# Patient Record
Sex: Male | Born: 1990
Health system: Southern US, Community
[De-identification: ages and names within clinical notes are randomized; demographics above are authoritative.]

## PROBLEM LIST (undated history)

## (undated) DIAGNOSIS — F1911 Other psychoactive substance abuse, in remission: Secondary | ICD-10-CM

## (undated) HISTORY — PX: OTHER SURGICAL HISTORY: SHX169

---
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
## 2010-11-30 LAB — DIFFERENTIAL
Basophils Absolute: 0
Basophils Absolute: 0.3 — ABNORMAL HIGH
Basophils Relative: 0
Basophils Relative: 0
Eosinophils Absolute: 2.8 — ABNORMAL HIGH
Eosinophils Relative: 6 — ABNORMAL HIGH
Eosinophils Relative: 8 — ABNORMAL HIGH
Eosinophils Relative: 9 — ABNORMAL HIGH
Lymphocytes Relative: 22 — ABNORMAL LOW
Lymphocytes Relative: 38
Lymphocytes Relative: 43
Monocytes Absolute: 1.5 — ABNORMAL HIGH
Monocytes Absolute: 2 — ABNORMAL HIGH
Monocytes Relative: 14 — ABNORMAL HIGH
Neutro Abs: 3.6
Neutro Abs: 10.4 — ABNORMAL HIGH
Neutrophils Relative %: 37 — ABNORMAL LOW
Neutrophils Relative %: 40 — ABNORMAL LOW
Neutrophils Relative %: 41 — ABNORMAL LOW
WBC Morphology: INCREASED

## 2010-11-30 LAB — CBC
HCT: 39.4
HCT: 38.1
HCT: 39.5
Hemoglobin: 13.4
Hemoglobin: 12.3
Hemoglobin: 14.5
MCHC: 34
MCHC: 33.6
MCHC: 34.6
MCHC: 35
MCHC: 35.1
MCV: 86.9
MCV: 88.4
MCV: 87.7
MCV: 88.8
MCV: 89.1
Platelets: 122 — ABNORMAL LOW
Platelets: 177
Platelets: 238
RBC: 4.48
RBC: 4.85
RDW: 13.4
RDW: 13.4
RDW: 13.4
WBC: 4.2 — ABNORMAL LOW
WBC: 5.7
WBC: 25 — ABNORMAL HIGH

## 2010-11-30 LAB — COMPREHENSIVE METABOLIC PANEL
ALT: 123 — ABNORMAL HIGH
ALT: 71 — ABNORMAL HIGH
AST: 70 — ABNORMAL HIGH
Albumin: 3.3 — ABNORMAL LOW
Albumin: 2.2 — ABNORMAL LOW
Albumin: 2.2 — ABNORMAL LOW
Albumin: 2.2 — ABNORMAL LOW
Alkaline Phosphatase: 320 — ABNORMAL HIGH
BUN: 8
BUN: 6
BUN: 9
CO2: 28
CO2: 27
Calcium: 9
Calcium: 8.1 — ABNORMAL LOW
Calcium: 8.6
Chloride: 100
Chloride: 101
Chloride: 105
Chloride: 106
Creatinine, Ser: 0.96
Creatinine, Ser: 0.78
Creatinine, Ser: 0.85
Creatinine, Ser: 0.88
Glucose, Bld: 93
Potassium: 3.3 — ABNORMAL LOW
Sodium: 136
Total Bilirubin: 1.5 — ABNORMAL HIGH
Total Bilirubin: 0.8
Total Bilirubin: 1.4 — ABNORMAL HIGH
Total Bilirubin: 1.5 — ABNORMAL HIGH
Total Protein: 4.6 — ABNORMAL LOW
Total Protein: 4.9 — ABNORMAL LOW

## 2010-11-30 LAB — URINALYSIS, ROUTINE W REFLEX MICROSCOPIC
Glucose, UA: 100 — AB
Glucose, UA: NEGATIVE
Hgb urine dipstick: NEGATIVE
Protein, ur: 100 — AB
Specific Gravity, Urine: 1.019
Urobilinogen, UA: 4 — ABNORMAL HIGH
pH: 6

## 2010-11-30 LAB — TECHNOLOGIST SMEAR REVIEW: Path Review: INCREASED

## 2010-11-30 LAB — EPSTEIN BARR VIRUS(EBV) BY PCR

## 2010-11-30 LAB — CMV IGM: CMV IgM: <0.9 Index (ref <0.90)

## 2010-11-30 LAB — BASIC METABOLIC PANEL
CO2: 32
Chloride: 99
Creatinine, Ser: 1.04
Potassium: 3.6
Sodium: 136

## 2010-11-30 LAB — GC/CHLAMYDIA PROBE AMP, GENITAL
Chlamydia, DNA Probe: NEGATIVE
GC Probe Amp, Genital: NEGATIVE

## 2010-11-30 LAB — GONOCOCCUS CULTURE

## 2010-11-30 LAB — URINE CULTURE: Colony Count: NO GROWTH

## 2010-11-30 LAB — URINE MICROSCOPIC-ADD ON

## 2010-11-30 LAB — LACTATE DEHYDROGENASE: LDH: 392 — ABNORMAL HIGH

## 2010-11-30 LAB — C-REACTIVE PROTEIN
CRP: 13 — ABNORMAL HIGH (ref <0.6)
CRP: 2.4 — ABNORMAL HIGH (ref <0.6)

## 2010-11-30 LAB — SEDIMENTATION RATE
Sed Rate: 8
Sed Rate: 0
Sed Rate: 8

## 2010-11-30 LAB — FERRITIN
Ferritin: 412 — ABNORMAL HIGH (ref 22–322)
Ferritin: 522 — ABNORMAL HIGH (ref 22–322)

## 2010-11-30 LAB — CULTURE, BLOOD (ROUTINE X 2)

## 2010-11-30 LAB — CULTURE, BLOOD (SINGLE)

## 2014-08-31 ENCOUNTER — Emergency Department (HOSPITAL_COMMUNITY): Payer: Self-pay

## 2014-08-31 ENCOUNTER — Emergency Department (HOSPITAL_COMMUNITY)
Admission: EM | Admit: 2014-08-31 | Discharge: 2014-08-31 | Disposition: A | Discharge status: Home / Self Care | Payer: Self-pay | Attending: Emergency Medicine | Admitting: Emergency Medicine

## 2014-08-31 ENCOUNTER — Encounter (HOSPITAL_COMMUNITY): Payer: Self-pay | Admitting: Emergency Medicine

## 2014-08-31 DIAGNOSIS — M545 Low back pain, unspecified: Secondary | ICD-10-CM

## 2014-08-31 DIAGNOSIS — Y9389 Activity, other specified: Secondary | ICD-10-CM | POA: Insufficient documentation

## 2014-08-31 DIAGNOSIS — Y92410 Unspecified street and highway as the place of occurrence of the external cause: Secondary | ICD-10-CM | POA: Insufficient documentation

## 2014-08-31 DIAGNOSIS — S3992XA Unspecified injury of lower back, initial encounter: Secondary | ICD-10-CM | POA: Insufficient documentation

## 2014-08-31 DIAGNOSIS — Y998 Other external cause status: Secondary | ICD-10-CM | POA: Insufficient documentation

## 2014-08-31 DIAGNOSIS — S0990XA Unspecified injury of head, initial encounter: Secondary | ICD-10-CM | POA: Insufficient documentation

## 2014-08-31 DIAGNOSIS — M542 Cervicalgia: Secondary | ICD-10-CM

## 2014-08-31 DIAGNOSIS — S79921A Unspecified injury of right thigh, initial encounter: Secondary | ICD-10-CM | POA: Insufficient documentation

## 2014-08-31 DIAGNOSIS — S199XXA Unspecified injury of neck, initial encounter: Secondary | ICD-10-CM | POA: Insufficient documentation

## 2014-08-31 LAB — I-STAT CHEM 8, ED
BUN: 11 mg/dL (ref 6–20)
CREATININE: 0.8 mg/dL (ref 0.61–1.24)
Calcium, Ion: 1.27 mmol/L — ABNORMAL HIGH (ref 1.12–1.23)
Chloride: 102 mmol/L (ref 101–111)
Glucose, Bld: 87 mg/dL (ref 65–99)
HEMATOCRIT: 44 % (ref 39.0–52.0)
Hemoglobin: 15 g/dL (ref 13.0–17.0)
POTASSIUM: 4.2 mmol/L (ref 3.5–5.1)
SODIUM: 142 mmol/L (ref 135–145)
TCO2: 26 mmol/L (ref 0–100)

## 2014-08-31 MED ORDER — SODIUM CHLORIDE 0.9 % IV SOLN
INTRAVENOUS | Status: DC
  Administered 2014-08-31: 16:00:00 via INTRAVENOUS

## 2014-08-31 MED ORDER — IOHEXOL 300 MG/ML  SOLN
100.0000 mL | Freq: Once | INTRAMUSCULAR | Status: AC | PRN
  Administered 2014-08-31: 100 mL via INTRAVENOUS

## 2014-08-31 MED ORDER — NAPROXEN 250 MG PO TABS: 250.0000 mg | ORAL_TABLET | Freq: Two times a day (BID) | ORAL | Status: DC | PRN

## 2014-08-31 MED ORDER — HYDROCODONE-ACETAMINOPHEN 5-325 MG PO TABS: ORAL_TABLET | ORAL | Status: DC

## 2014-08-31 MED ORDER — METHOCARBAMOL 500 MG PO TABS: 1000.0000 mg | ORAL_TABLET | Freq: Four times a day (QID) | ORAL | Status: DC | PRN
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
## 2014-08-31 NOTE — ED Notes (Signed)
Pt alert & oriented x4, stable gait. Patient given discharge instructions, paperwork & prescription(s). Patient  instructed to stop at the registration desk to finish any additional paperwork. Patient verbalized understanding. Pt left department w/ no further questions. 

## 2014-08-31 NOTE — Discharge Instructions (Signed)
Emergency Department Resource Guide 1) Find a Doctor and Pay Out of Pocket Although you won't have to find out who is covered by your insurance plan, it is a good idea to ask around and get recommendations. You will then need to call the office and see if the doctor you have chosen will accept you as a new patient and what types of options they offer for patients who are self-pay. Some doctors offer discounts or will set up payment plans for their patients who do not have insurance, but you will need to ask so you aren't surprised when you get to your appointment.  2) Contact Your Local Health Department Not all health departments have doctors that can see patients for sick visits, but many do, so it is worth a call to see if yours does. If you don't know where your local health department is, you can check in your phone book. The CDC also has a tool to help you locate your state's health department, and many state websites also have listings of all of their local health departments.  3) Find a [facility] If your illness is not likely to be very severe or complicated, you may want to try a walk in clinic. These are popping up all over the country in pharmacies, drugstores, and shopping centers. They're usually staffed by nurse practitioners or physician assistants that have been trained to treat common illnesses and complaints. They're usually fairly quick and inexpensive. However, if you have serious medical issues or chronic medical problems, these are probably not your best option.  No Primary Care Doctor: - Call Health Connect at  [phone] - they can help you locate a primary care doctor that  accepts your insurance, provides certain services, etc. - Physician Referral Service- [phone]  Chronic Pain Problems: Organization         Address  Phone   Notes  [facility]  [phone] Patients need to be referred by their primary care doctor.   Medication  Assistance: Organization         Address  Phone   Notes  [facility] Medication [facility] [address] [place], [place] [address] [phone] --Must be a resident of [facility] -- Must have NO insurance coverage whatsoever (no Medicaid/ Medicare, etc.) -- The pt. MUST have a primary care doctor that directs their care regularly and follows them in the community   [facility]ist  [phone]   [employer]  [phone]    Agencies that provide inexpensive medical care: Organization         Address  Phone   Notes  [place]  [phone]   [facility]    [phone]   [facility] [place], [place] [address] [phone]   [place] [address], [place] [phone]   Planned Parenthood    [phone]   [facility]    [phone]   [place] and [address], [place] Phone:  [phone], Fax:  [phone] Hours of Operation:  9 am - 6 pm, M-F.  Also accepts Medicaid/Medicare and self-pay.  [facility] [place] [address], [address], [place] Phone: [phone], Fax: [phone]. Hours of Operation:  8:30 am - 5:30 pm, M-F.  Also accepts Medicaid and self-pay.  [facility] [address], [place] Phone: [phone]   [place], [place], [place] [phone], Ext. [phone] Mondays & Thursdays: 7-9 AM.  First 15 patients are seen on a first come, first serve basis.    [place] Providers:  Organization         Address  Phone   Notes  [facility] [address], [place] [phone] Also accepts self-pay patients.  [facility] [address], [place]  [phone]   [place], [address], [place]  [phone]   [facility] [address], [place] [phone]   Lucianne Lei [address], [place]   [phone] Only accepts [place] Access [place] patients after they have their name applied to their card.   Self-Pay (no insurance) in [facility]:  Organization         Address  Phone   Notes  Sickle Cell Patients, [facility] Internal Medicine [place] [phone]   [facility] [place] [phone]   [facility] [place]  [place], [place], [place] [phone]   [facility]/Dr. Osei-Bonsu  [address] [place] or [address], [place] [phone] Phone number for both [place] and [place] locations is the same.  [facility]gent Medical and [facility] [address] [place] [phone]   [facility] [address], [place] or [address] [phone]   [facility] [address], [place] [phone], phone; [phone], fax Sees patients 1st and 3rd Saturday of every month.  Must not qualify for public or private insurance (i.e. Medicaid, Medicare, [place] Health Choice, Veterans' Benefits)  Household income should be no more than 200% of the poverty level The clinic cannot treat you if you are pregnant or think you are pregnant  Sexually transmitted diseases are not treated at the clinic.    Dental Care: Organization         Address  Phone  Notes  [facility] Department of [facility] [place] [phone] Accepts children up to age 75 who are enrolled in [place] or [place]; pregnant women with a Medicaid card; and children who have applied for Medicaid or [place] Health Choice, but were declined, whose parents can pay a reduced fee at time of service.  [facility]  Department of [facility]  [address], [place] [phone] Accepts children up to age 85 who are enrolled in [place] or [place]; pregnant women with a Medicaid card; and children who have applied for Medicaid or [place] Health Choice, but were declined, whose parents can pay a reduced fee at time of service.  [place] Adult Dental Access PROGRAM  [place] [phone] Patients are seen by appointment only. Walk-ins are not accepted. [place] will see patients 39 years of age and older. Monday - Tuesday (8am-5pm) Most Wednesdays (8:30-5pm) $30 per visit, cash only  [facility] Adult Dental Access PROGRAM  [address], [facility] [phone] Patients are seen by appointment only. Walk-ins are not accepted. [place] will see patients 16 years of age and older. One  Wednesday Evening (Monthly: Volunteer Based).  $30 per visit, cash only  [place]  [phone] for adults; Children under age 9, call Graduate Pediatric Dentistry at [phone]. Children aged 70-14, please call [phone] to request a pediatric application.  Dental services are provided in all areas of dental care including fillings, crowns and bridges, complete and partial dentures, implants, gum treatment, root canals, and extractions. Preventive care is also provided. Treatment is provided to both adults and children. Patients are selected via a lottery and there is often a waiting list.   [facility] [address] [place]  [phone] [email or web address]   [facility] [address] [place], [place] [phone], Ext. [phone] Second and Fourth Thursday of each month, opens at 6:30 AM; Clinic ends at 9 AM.  Patients are seen on a first-come first-served basis, and a limited number are seen during each clinic.   [facility]  [address] Hillard Danker [place], [place] [phone]    Eligibility Requirements You must have lived in [place], [place], or [place] counties for at least the last three months.   You cannot be eligible for state or federal sponsored [employer], including [employer], [place], or [employer].   You generally cannot be eligible for healthcare insurance through your employer.    How to apply: Eligibility screenings are held every Tuesday and Wednesday afternoon from 1:00 pm until 4:00 pm. You do not need an appointment for the interview!  [facility] [address] [place], [place]   [place]  [place] Department  [place]  [phone]    Behavioral Health Resources in the Community: Intensive Outpatient Programs Organization         Address  Phone  Notes  [place] Leroy. [address], [place], [place] [phone]   [facility] Outpatient [address] [place], [place]   ADS: Alcohol & Drug Svcs [address], [place], [place]   [place] [address]  [place], [place] or [phone]   Substance Abuse Resources Organization         Address  Phone  Notes  Alcohol and Drug Services  [phone]   [place]  [phone]   [facility] [place]   Chinita Pester  [phone]   Residential & Outpatient Substance Abuse Program  [phone]   Psychological Services Organization         Address  Phone  Notes  [facility] [place]  [place]  [phone]   [place] [address], [place] or [phone]    Mobile Crisis Teams Organization         Address  Phone  Notes  [facility], Mobile Crisis Care Unit  [phone]   [facility]  [address]  [place], [place]   [place] [address] [place] [phone]    Self-Help/Support Groups Organization         Address  Phone             Notes  [place]. of [place] - variety of support groups  [place] Call for more information  Narcotics Anonymous (NA), [facility] [address], [employer] [place]  2 meetings at this location  Residential Treatment Programs Organization         Address  Phone  Notes  [facility] [address]    [place]  [phone]   [facility]  [address] [place] [address]15, [place], [place]   Eugenio Saenz [place], [place] [phone] Admissions: 8am-3pm M-F  Incentives Substance [place] [address]    [place], [place] [phone]   [facility] [address] [place], [place], [place]   [facility] [address],  [place], [place]   [facility] Programs - Intensive Outpatient [address], [place], [place]   [facility] [address]  [place], [place] [phone] or [phone]   [facility]idential Treatment Services (RTS) [address], [place], [place] Accepts Medicaid  [facility] [place] [address],  [place] [phone] Substance Abuse/Addiction Treatment   [facility] Organization         Address  Phone  Notes  [facility]  [phone]   Domenic Schwab, PhD [address] Arlis Porta [place], [place]   [phone] or [phone]   [place], [place] [phone]   [facility] [address] [place], [place] [phone] Insurance/Medicaid/sponsorship through [facility] and Families [address], [address] [place]                                    [place], [place] [phone] [place] [address], [place] [phone]    Dr. Adele Schilder  [phone]   [facility] Dept. 1) [address] [place] 2) [place] 3)  [place] [address], [place] [phone]  [phone]   [place] [phone] or [phone] (After Hours)      Take the prescriptions as directed.  Apply moist heat or ice to the area(s) of discomfort, for 15 minutes at a time, several times per day for the next few days.  Do not fall asleep on a heating or ice pack.  Call your regular medical doctor tomorrow to schedule a follow up appointment in the next 3 days.  Return to the Emergency Department immediately if worsening.

## 2016-04-14 DIAGNOSIS — Z6824 Body mass index (BMI) 24.0-24.9, adult: Secondary | ICD-10-CM | POA: Diagnosis not present

## 2016-04-14 DIAGNOSIS — K648 Other hemorrhoids: Secondary | ICD-10-CM | POA: Diagnosis not present

## 2016-10-29 DIAGNOSIS — L02211 Cutaneous abscess of abdominal wall: Secondary | ICD-10-CM | POA: Diagnosis not present

## 2016-10-29 DIAGNOSIS — Z6826 Body mass index (BMI) 26.0-26.9, adult: Secondary | ICD-10-CM | POA: Diagnosis not present

## 2017-02-15 DIAGNOSIS — L03116 Cellulitis of left lower limb: Secondary | ICD-10-CM | POA: Diagnosis not present

## 2017-02-15 DIAGNOSIS — Z6825 Body mass index (BMI) 25.0-25.9, adult: Secondary | ICD-10-CM | POA: Diagnosis not present

## 2017-05-02 DIAGNOSIS — Z6824 Body mass index (BMI) 24.0-24.9, adult: Secondary | ICD-10-CM | POA: Diagnosis not present

## 2017-05-02 DIAGNOSIS — J019 Acute sinusitis, unspecified: Secondary | ICD-10-CM | POA: Diagnosis not present

## 2018-03-06 DIAGNOSIS — M546 Pain in thoracic spine: Secondary | ICD-10-CM | POA: Diagnosis not present

## 2018-03-06 DIAGNOSIS — M9903 Segmental and somatic dysfunction of lumbar region: Secondary | ICD-10-CM | POA: Diagnosis not present

## 2018-03-06 DIAGNOSIS — M545 Low back pain: Secondary | ICD-10-CM | POA: Diagnosis not present

## 2018-03-06 DIAGNOSIS — M9902 Segmental and somatic dysfunction of thoracic region: Secondary | ICD-10-CM | POA: Diagnosis not present

## 2018-03-10 DIAGNOSIS — M9902 Segmental and somatic dysfunction of thoracic region: Secondary | ICD-10-CM | POA: Diagnosis not present

## 2018-03-10 DIAGNOSIS — M545 Low back pain: Secondary | ICD-10-CM | POA: Diagnosis not present

## 2018-03-10 DIAGNOSIS — Z6823 Body mass index (BMI) 23.0-23.9, adult: Secondary | ICD-10-CM | POA: Diagnosis not present

## 2018-03-10 DIAGNOSIS — R5383 Other fatigue: Secondary | ICD-10-CM | POA: Diagnosis not present

## 2018-03-10 DIAGNOSIS — M9903 Segmental and somatic dysfunction of lumbar region: Secondary | ICD-10-CM | POA: Diagnosis not present

## 2018-03-10 DIAGNOSIS — M546 Pain in thoracic spine: Secondary | ICD-10-CM | POA: Diagnosis not present

## 2018-03-14 DIAGNOSIS — M546 Pain in thoracic spine: Secondary | ICD-10-CM | POA: Diagnosis not present

## 2018-03-14 DIAGNOSIS — M9903 Segmental and somatic dysfunction of lumbar region: Secondary | ICD-10-CM | POA: Diagnosis not present

## 2018-03-14 DIAGNOSIS — M9902 Segmental and somatic dysfunction of thoracic region: Secondary | ICD-10-CM | POA: Diagnosis not present

## 2018-03-14 DIAGNOSIS — M545 Low back pain: Secondary | ICD-10-CM | POA: Diagnosis not present

## 2018-03-19 DIAGNOSIS — M9903 Segmental and somatic dysfunction of lumbar region: Secondary | ICD-10-CM | POA: Diagnosis not present

## 2018-03-19 DIAGNOSIS — M9902 Segmental and somatic dysfunction of thoracic region: Secondary | ICD-10-CM | POA: Diagnosis not present

## 2018-03-19 DIAGNOSIS — M546 Pain in thoracic spine: Secondary | ICD-10-CM | POA: Diagnosis not present

## 2018-03-19 DIAGNOSIS — M545 Low back pain: Secondary | ICD-10-CM | POA: Diagnosis not present

## 2018-04-29 DIAGNOSIS — J019 Acute sinusitis, unspecified: Secondary | ICD-10-CM | POA: Diagnosis not present

## 2018-05-09 DIAGNOSIS — Z6823 Body mass index (BMI) 23.0-23.9, adult: Secondary | ICD-10-CM | POA: Diagnosis not present

## 2018-05-09 DIAGNOSIS — J0101 Acute recurrent maxillary sinusitis: Secondary | ICD-10-CM | POA: Diagnosis not present

## 2018-05-09 DIAGNOSIS — J029 Acute pharyngitis, unspecified: Secondary | ICD-10-CM | POA: Diagnosis not present

## 2019-05-14 ENCOUNTER — Emergency Department (HOSPITAL_COMMUNITY): Payer: BC Managed Care – PPO

## 2019-05-14 ENCOUNTER — Other Ambulatory Visit: Payer: Self-pay

## 2019-05-14 ENCOUNTER — Ambulatory Visit
Admission: EM | Admit: 2019-05-14 | Discharge: 2019-05-14 | Disposition: A | Discharge status: Home / Self Care | Payer: BC Managed Care – PPO | Source: Home / Self Care

## 2019-05-14 ENCOUNTER — Encounter (HOSPITAL_COMMUNITY): Payer: Self-pay | Admitting: Emergency Medicine

## 2019-05-14 ENCOUNTER — Emergency Department (HOSPITAL_COMMUNITY)
Admission: EM | Admit: 2019-05-14 | Discharge: 2019-05-14 | Disposition: A | Discharge status: Home / Self Care | Payer: BC Managed Care – PPO | Attending: Emergency Medicine | Admitting: Emergency Medicine

## 2019-05-14 DIAGNOSIS — K625 Hemorrhage of anus and rectum: Secondary | ICD-10-CM | POA: Diagnosis not present

## 2019-05-14 DIAGNOSIS — F17220 Nicotine dependence, chewing tobacco, uncomplicated: Secondary | ICD-10-CM | POA: Insufficient documentation

## 2019-05-14 DIAGNOSIS — K529 Noninfective gastroenteritis and colitis, unspecified: Secondary | ICD-10-CM

## 2019-05-14 DIAGNOSIS — K921 Melena: Secondary | ICD-10-CM

## 2019-05-14 DIAGNOSIS — K922 Gastrointestinal hemorrhage, unspecified: Secondary | ICD-10-CM | POA: Diagnosis not present

## 2019-05-14 DIAGNOSIS — R197 Diarrhea, unspecified: Secondary | ICD-10-CM | POA: Diagnosis not present

## 2019-05-14 HISTORY — DX: Other psychoactive substance abuse, in remission: F19.11

## 2019-05-14 LAB — URINALYSIS, ROUTINE W REFLEX MICROSCOPIC
Bacteria, UA: NONE SEEN
Bilirubin Urine: NEGATIVE
Glucose, UA: NEGATIVE mg/dL
Hgb urine dipstick: NEGATIVE
Ketones, ur: NEGATIVE mg/dL
Leukocytes,Ua: NEGATIVE
Nitrite: NEGATIVE
Protein, ur: NEGATIVE mg/dL
Specific Gravity, Urine: 1.021 (ref 1.005–1.030)
pH: 8 (ref 5.0–8.0)

## 2019-05-14 LAB — CBC WITH DIFFERENTIAL/PLATELET
Abs Immature Granulocytes: 0.01 10*3/uL (ref 0.00–0.07)
Basophils Absolute: 0.1 10*3/uL (ref 0.0–0.1)
Basophils Relative: 1 %
Eosinophils Absolute: 0.5 10*3/uL (ref 0.0–0.5)
Eosinophils Relative: 9 %
HCT: 40.6 % (ref 39.0–52.0)
Hemoglobin: 13.4 g/dL (ref 13.0–17.0)
Immature Granulocytes: 0 %
Lymphocytes Relative: 20 %
Lymphs Abs: 1 10*3/uL (ref 0.7–4.0)
MCH: 30.7 pg (ref 26.0–34.0)
MCHC: 33 g/dL (ref 30.0–36.0)
MCV: 93.1 fL (ref 80.0–100.0)
Monocytes Absolute: 0.5 10*3/uL (ref 0.1–1.0)
Monocytes Relative: 10 %
Neutro Abs: 3 10*3/uL (ref 1.7–7.7)
Neutrophils Relative %: 60 %
Platelets: 220 10*3/uL (ref 150–400)
RBC: 4.36 MIL/uL (ref 4.22–5.81)
RDW: 12.1 % (ref 11.5–15.5)
WBC: 5 10*3/uL (ref 4.0–10.5)
nRBC: 0 % (ref 0.0–0.2)

## 2019-05-14 LAB — COMPREHENSIVE METABOLIC PANEL
ALT: 43 U/L (ref 0–44)
AST: 34 U/L (ref 15–41)
Albumin: 4.6 g/dL (ref 3.5–5.0)
Alkaline Phosphatase: 50 U/L (ref 38–126)
Anion gap: 7 (ref 5–15)
BUN: 18 mg/dL (ref 6–20)
CO2: 26 mmol/L (ref 22–32)
Calcium: 9.1 mg/dL (ref 8.9–10.3)
Chloride: 104 mmol/L (ref 98–111)
Creatinine, Ser: 0.88 mg/dL (ref 0.61–1.24)
GFR calc Af Amer: >60 mL/min (ref >60)
GFR calc non Af Amer: >60 mL/min (ref >60)
Glucose, Bld: 84 mg/dL (ref 70–99)
Potassium: 3.6 mmol/L (ref 3.5–5.1)
Sodium: 137 mmol/L (ref 135–145)
Total Bilirubin: 0.9 mg/dL (ref 0.3–1.2)
Total Protein: 7.9 g/dL (ref 6.5–8.1)

## 2019-05-14 LAB — POC OCCULT BLOOD, ED: Fecal Occult Bld: POSITIVE — AB

## 2019-05-14 MED ORDER — IOHEXOL 300 MG/ML  SOLN
100.0000 mL | Freq: Once | INTRAMUSCULAR | Status: AC | PRN
  Administered 2019-05-14: 100 mL via INTRAVENOUS

## 2019-05-14 NOTE — ED Triage Notes (Signed)
Pt reports has had bloody stools for the past 1 1/2 months.  Reports has approx 10 episodes per day.  Reports stool is very loose.  Denies abd pain at this time.

## 2019-05-14 NOTE — ED Notes (Signed)
Report given to [facility] RN at [facility] ED.

## 2019-05-14 NOTE — ED Triage Notes (Signed)
Bloody stools for the past 1 1/2 months, 10 episodes of loose stools per day.  Denies pain

## 2019-05-14 NOTE — ED Provider Notes (Signed)
[place]   [id number] 05/14/19 Arrival Time: 1227  CC: ABDOMINAL DISCOMFORT  SUBJECTIVE:  Michael Mcdowell is a 29 y.o. male who presents with complaint of bloody and frequent (apx 10 episodes per day) stools/ diarrhea x 1.5 months.  Denies a precipitating event, trauma, or straining.  Denies changes in diet or excessive NSAID use.  Denies abdominal pain.  Has not tried OTC medications.  Worse with holding bowel movements.  Denies similar symptoms in the past.  Last BM 1 hr ago with bloody stools.  Also reports blood in comode and with wiping, and gas.    Denies fever, chills, appetite changes, weight changes, nausea, vomiting, chest pain, lightheadedness, dizziness, SOB, constipation, melena, dysuria, difficulty urinating, increased frequency or urgency, flank pain, loss of bowel or bladder function.  Denies known person or family hx of Crohns, UC, or colon cancer.    ROS: As per HPI.  All other pertinent ROS negative.     History reviewed. No pertinent past medical history. History reviewed. No pertinent surgical history. Allergies  Allergen Reactions  . Doxycycline Other (See Comments)    Possible reaction.  Pt not sure.   No current facility-administered medications on file prior to encounter.   Current Outpatient Medications on File Prior to Encounter  Medication Sig Dispense Refill  . ibuprofen (ADVIL,MOTRIN) 200 MG tablet Take 600 mg by mouth every 6 (six) hours as needed for moderate pain.     Social History   Socioeconomic History  . Marital status: Single    Spouse name: Not on file  . Number of children: Not on file  . Years of education: Not on file  . Highest education level: Not on file  Occupational History  . Not on file  Tobacco Use  . Smoking status: Never Smoker  . Smokeless tobacco: Current User    Types: Chew  Substance and Sexual Activity  . Alcohol use: Never  . Drug use: Not on file    Comment: pain pills 5 years ago  . Sexual  activity: Not on file  Other Topics Concern  . Not on file  Social History Narrative  . Not on file   Social Determinants of Health   Financial Resource Strain:   . Difficulty of Paying Living Expenses:   Food Insecurity:   . Worried About Charity fundraiser in the Last Year:   . Arboriculturist in the Last Year:   Transportation Needs:   . Film/video editor (Medical):   Marland Kitchen Lack of Transportation (Non-Medical):   Physical Activity:   . Days of Exercise per Week:   . Minutes of Exercise per Session:   Stress:   . Feeling of Stress :   Social Connections:   . Frequency of Communication with Friends and Family:   . Frequency of Social Gatherings with Friends and Family:   . Attends Religious Services:   . Active Member of Clubs or Organizations:   . Attends Archivist Meetings:   Marland Kitchen Marital Status:   Intimate Partner Violence:   . Fear of Current or Ex-Partner:   . Emotionally Abused:   Marland Kitchen Physically Abused:   . Sexually Abused:    Family History  Problem Relation Age of Onset  . Cancer Paternal Grandmother      OBJECTIVE:  Vitals:   05/14/19 1235  BP: (!) 163/78  Pulse: 87  Resp: 18  Temp: 98.6 F (37 C)  TempSrc: Oral  SpO2: 98%  Weight: 145 lb (65.8 kg)  Height: 5' 7"  (1.702 m)    General appearance: Alert; NAD HEENT: NCAT.  PERRL, EOMI grossly; Oropharynx clear.  Lungs: clear to auscultation bilaterally without adventitious breath sounds Heart: regular rate and rhythm.  Abdomen: soft, non-distended; normal active bowel sounds; non-tender to light and deep palpation; nontender at McBurney's point; negative Murphy's sign; no guarding Extremities: no edema; symmetrical with no gross deformities Skin: warm and dry Neurologic: normal gait Psychological: alert and cooperative; normal mood and affect  ASSESSMENT & PLAN:  1. Rectal bleeding   2. Bloody stools    Would like to defer rectal exam to ED provider.   Recommending further  evaluation and management in the ED cannot rule out diverticular bleeding or internal hemorrhoids/ mass in office.  Patient aware and in agreement with plan.  [facility] ED.      Lestine Box, PA-C 05/14/19 1328

## 2019-05-14 NOTE — Discharge Instructions (Addendum)
Clear fluids and regular diet.  Avoid anti-inflammatory medications such as aspirin, Goody powders or BC powders, Advil, ibuprofen, and Aleve.  You may take Tylenol if needed for pain.  Call Dr. Roseanne Kaufman office tomorrow to schedule a follow-up appointment.

## 2019-05-14 NOTE — ED Notes (Signed)
Pt is aware we need urine sample, urinal at bedside.  

## 2019-05-14 NOTE — ED Provider Notes (Signed)
[facility] EMERGENCY DEPARTMENT Provider Note   CSN: [id number] Arrival date & time: 05/14/19  1317     History Chief Complaint  Patient presents with  . Diarrhea    Michael Mcdowell is a 29 y.o. male presenting with an approximate 5-monthhistory of daily bloody stools in addition to description of having no formed stools during this length of time.  He describes having as many as 10 stools per day with bright red liquid blood both on the toilet paper and in the toilet with episodes.  He denies abdominal pain, but does describe intermittent increased gas-like cramping.  He denies fevers or chills, dysuria, back pain, epigastric pain, acid reflux.  He denies any chronic medication use, specifically aspirin or NSAIDs.  He takes an occasional BC powder for headache but less than 1 time per week on average.  No family history of similar symptoms, no Crohn's disease or ulcerative colitis in the family.  He maintains a good appetite with no unexplained weight loss.  He denies rectal pain.  He has had an external hemorrhoid in the past but denies this current complaint.  He has had no recent antibiotic use, denies any other complaints.  HPI     Past Medical History:  Diagnosis Date  . Substance [facility] in [facility] ([facility]     Patient Active Problem List   Diagnosis Date Noted  . KNEE PAIN, RIGHT 10/15/2007  . TALIPES CAVUS 10/15/2007    History reviewed. No pertinent surgical history.     Family History  Problem Relation Age of Onset  . Cancer Paternal Grandmother     Social History   Tobacco Use  . Smoking status: Never Smoker  . Smokeless tobacco: Current User    Types: Chew  Substance Use Topics  . Alcohol use: Never  . Drug use: Not on file    Comment: pain pills 5 years ago    Home Medications Prior to Admission medications   Medication Sig Start Date End Date Taking? Authorizing Provider  ibuprofen (ADVIL,MOTRIN) 200 MG tablet Take 600 mg by mouth every 6 (six)  hours as needed for moderate pain.    [provider]    Allergies    Doxycycline  Review of Systems   Review of Systems  Constitutional: Negative for chills and fever.  HENT: Negative for congestion and sore throat.   Eyes: Negative.   Respiratory: Negative for chest tightness and shortness of breath.   Cardiovascular: Negative for chest pain.  Gastrointestinal: Positive for blood in stool and diarrhea. Negative for abdominal pain, nausea and vomiting.  Genitourinary: Negative.   Musculoskeletal: Negative for arthralgias, joint swelling and neck pain.  Skin: Negative.  Negative for rash and wound.  Neurological: Negative for dizziness, weakness, light-headedness, numbness and headaches.  Psychiatric/Behavioral: Negative.     Physical Exam Updated Vital Signs BP 136/83 (BP Location: Right Arm)   Pulse 74   Temp 98.6 F (37 C) (Oral)   Resp 18   Ht 5' 7"  (1.702 m)   Wt 65.8 kg   SpO2 98%   BMI 22.71 kg/m   Physical Exam Vitals and nursing note reviewed. Exam conducted with a chaperone present.  Constitutional:      Appearance: He is well-developed.  HENT:     Head: Normocephalic and atraumatic.  Eyes:     Conjunctiva/sclera: Conjunctivae normal.  Cardiovascular:     Rate and Rhythm: Normal rate and regular rhythm.     Heart sounds: Normal heart sounds.  Pulmonary:     Effort: Pulmonary effort is normal.     Breath sounds: Normal breath sounds. No wheezing.  Abdominal:     General: Bowel sounds are normal.     Palpations: Abdomen is soft.     Tenderness: There is no abdominal tenderness.  Genitourinary:    Rectum: Guaiac result positive. No mass, tenderness, anal fissure, external hemorrhoid or internal hemorrhoid.  Musculoskeletal:        General: Normal range of motion.     Cervical back: Normal range of motion.  Skin:    General: Skin is warm and dry.  Neurological:     Mental Status: He is alert.     ED Results / Procedures / Treatments     Labs (all labs ordered are listed, but only abnormal results are displayed) Labs Reviewed  URINALYSIS, ROUTINE W REFLEX MICROSCOPIC - Abnormal; Notable for the following components:      Result Value   APPearance TURBID (*)    All other components within normal limits  POC OCCULT BLOOD, ED - Abnormal; Notable for the following components:   Fecal Occult Bld POSITIVE (*)    All other components within normal limits  CBC WITH DIFFERENTIAL/PLATELET  COMPREHENSIVE METABOLIC PANEL    EKG None  Radiology No results found.  Procedures Procedures (including critical care time)  Medications Ordered in ED Medications - No data to display  ED Course  I have reviewed the triage vital signs and the nursing notes.  Pertinent labs & imaging results that were available during my care of the patient were reviewed by me and considered in my medical decision making (see chart for details).    MDM Rules/Calculators/A&P                      Patient with a 1-1/2 to 97-monthhistory of daily bright red blood per rectum, multiple episodes. He is strongly Hemoccult positive although there is no visible blood on sample obtained. His hemoglobin is normal range. Currently pending CT imaging. If his CT scan is stable would anticipate discharge home with close follow-up with gastroenterology.  Discussed with [name] PA-C who assumes care of patient. Final Clinical Impression(s) / ED Diagnoses Final diagnoses:  Gastrointestinal hemorrhage, unspecified gastrointestinal hemorrhage type    Rx / DC Orders ED Discharge Orders    None       ILandis Martins03/11/21 1624    MNoemi Chapel MD 05/14/19 1756

## 2019-05-14 NOTE — Discharge Instructions (Addendum)
Recommending further evaluation and management in the ED cannot rule out diverticular bleeding or internal hemorrhoids/ mass in office.  Patient aware and in agreement with plan.  Will go by private vehicle to [facility] ED.

## 2019-05-14 NOTE — ED Provider Notes (Signed)
   Pt signed out to me by Michael Jefferson, PA-C with CT abd/pelvis pending.  He has a 2 month hx of rectal bleeding.  No significant abdominal pain, weight loss, vomiting or appetite changes.  No fever or hx of previous GI issues.  No recent ASA or other NSAID use.  Guaiac positive stool on exam    CT ABDOMEN PELVIS W CONTRAST  Result Date: 05/14/2019 CLINICAL DATA:  GI bleed. Blood in stool for 2 months with frequent diarrhea EXAM: CT ABDOMEN AND PELVIS WITH CONTRAST TECHNIQUE: Multidetector CT imaging of the abdomen and pelvis was performed using the standard protocol following bolus administration of intravenous contrast. CONTRAST:  151m OMNIPAQUE IOHEXOL 300 MG/ML  SOLN COMPARISON:  08/31/2014 FINDINGS: Lower chest: The lung bases appear clear. No pleural or pericardial effusion identified. Hepatobiliary: No focal liver abnormality is seen. No gallstones, gallbladder wall thickening, or biliary dilatation. Pancreas: Unremarkable. No pancreatic ductal dilatation or surrounding inflammatory changes. Spleen: Normal in size without focal abnormality. Adrenals/Urinary Tract: The adrenal glands are normal. The kidneys are unremarkable. No mass or hydronephrosis identified. The urinary bladder is within normal limits. Stomach/Bowel: Stomach is nondistended. The small bowel loops are unremarkable. No small bowel wall thickening, inflammation or distension. The appendix is visualized and appears normal. The proximal and mid colon appear normal. Mucosal enhancement and mild wall thickening is identified involving the sigmoid colon and rectum. There is hyperemia of the adjacent Vasa recta and multiple prominent pericolonic and perirectal lymph nodes, image 63/2 and image 60/2. Vascular/Lymphatic: No significant vascular findings are present. No enlarged abdominal or pelvic lymph nodes. Reproductive: Prostate is unremarkable. Other: No free fluid or fluid collections identified. Musculoskeletal: No acute or significant  osseous findings. IMPRESSION: 1. Examination is positive for mild wall thickening and mucosal enhancement involving the sigmoid colon and rectum concerning for inflammatory or infectious colitis. No complications identified. 2. Prominent pericolonic and perirectal lymph nodes are identified which may be reactive in etiology. 3. The appendix is visualized and appears normal. Electronically Signed   By: TKerby MoorsM.D.   On: 05/14/2019 16:56    Labs Reviewed  URINALYSIS, ROUTINE W REFLEX MICROSCOPIC - Abnormal; Notable for the following components:      Result Value   APPearance TURBID (*)    All other components within normal limits  POC OCCULT BLOOD, ED - Abnormal; Notable for the following components:   Fecal Occult Bld POSITIVE (*)    All other components within normal limits  CBC WITH DIFFERENTIAL/PLATELET  COMPREHENSIVE METABOLIC PANEL    Consulted GI, Dr. RGala Romneyand discussed findings.  He recommends to have pt contact his office for f/u and likely colonoscopy in near future.  He does not recommend antibiotics at this time given absence of significant abdominal pain, fever and leukocytosis.  Consulted pt to avoid NSAID use.  Return precautions also discussed       [name] PA-C 05/14/19 1750    MNoemi Chapel MD 05/14/19 1755

## 2019-05-20 ENCOUNTER — Ambulatory Visit: Payer: BC Managed Care – PPO | Admitting: Gastroenterology

## 2019-05-20 ENCOUNTER — Other Ambulatory Visit: Payer: Self-pay

## 2019-05-20 ENCOUNTER — Encounter: Payer: Self-pay | Admitting: Gastroenterology

## 2019-05-20 ENCOUNTER — Encounter: Payer: Self-pay | Admitting: *Deleted

## 2019-05-20 DIAGNOSIS — K529 Noninfective gastroenteritis and colitis, unspecified: Secondary | ICD-10-CM | POA: Diagnosis not present

## 2019-05-20 DIAGNOSIS — K625 Hemorrhage of anus and rectum: Secondary | ICD-10-CM | POA: Diagnosis not present

## 2019-05-20 NOTE — Patient Instructions (Addendum)
We have scheduled you for a colonoscopy in the near future.  I recommend a soft (low-fiber diet) for now. I have included a handout.  Further recommendations to follow!   It was a pleasure to see you today. I want to create trusting relationships with patients to provide genuine, compassionate, and quality care. I value your feedback. If you receive a survey regarding your visit,  I greatly appreciate you taking time to fill this out.   Annitta Needs, PhD, ANP-BC [facility]    Low-Fiber Eating Plan Fiber is found in fruits, vegetables, whole grains, and beans. Eating a diet low in fiber helps to reduce how often you have bowel movements and how much you produce during a bowel movement. A low-fiber eating plan may help your digestive system heal if:  You have certain conditions, such as Crohn's disease or diverticulitis.  You recently had radiation therapy on your pelvis or bowel.  You recently had intestinal surgery.  You have a new surgical opening in your abdomen (colostomy or ileostomy).  Your intestine is narrowed (stricture). Your health care provider will determine how long you need to stay on this diet. Your health care provider may recommend that you work with a diet and nutrition specialist (dietitian). What are tips for following this plan? General guidelines  Follow recommendations from your dietitian about how much fiber you should have each day.  Most people on this eating plan should try to eat less than 10 grams (g) of fiber each day. Your daily fiber goal is _________________ g.  Take vitamin and mineral supplements as told by your health care provider or dietitian. Chewable or liquid forms are best when on this eating plan. Reading food labels  Check food labels for the amount of dietary fiber.  Choose foods that have less than 2 grams of fiber in one serving. Cooking  Use white flour and other allowed grains for baking and cooking.  Cook  meat using methods that keep it tender, such as braising or poaching.  Cook eggs until the yolk is completely solid.  Cook with healthy oils, such as olive oil or canola oil. Meal planning   Eat 5-6 small meals throughout the day instead of 3 large meals.  If you are lactose intolerant: ? Choose low-lactose dairy foods. ? Do not eat dairy foods, if told by your dietitian.  Limit fat and oils to less than 8 teaspoons a day.  Eat small portions of desserts. What foods are allowed? The items listed below may not be a complete list. Talk with your dietitian about what dietary choices are best for you. Grains All bread and crackers made with white flour. Waffles, pancakes, and [place] toast. Bagels. Pretzels. Melba toast, zwieback, and matzoh. Cooked and dried cereals that do not contain whole grains, added fiber, seeds, or dried fruit. CornmealDomenick Gong. Hot and cold cereals made with refined corn, wheat, rice, or oats. Plain pasta and noodles. White rice. Vegetables Well-cooked or canned vegetables without skin, seeds, or stems. Cooked potatoes without skins. Vegetable juice. Fruits Soft-cooked or canned fruits without skin and seeds. Peeled ripe banana. Applesauce. Fruit juice without pulp. Meats and other protein foods Ground meat. Tender cuts of meat or poultry. Eggs. Fish, seafood, and shellfish. Smooth nut butters. Tofu. Dairy All milk products and drinks. Lactose-free milks, including rice, soy, and almond milks. Yogurt without fruit, nuts, chocolate, or granola mix-ins. Sour cream. Cottage cheese. Cheese. Beverages Decaf coffee. Fruit and vegetable juices or smoothies (in  small amounts, with no pulp or skins, and with fruits from allowed list). Sports drinks. Herbal tea. Fats and oils Olive oil, canola oil, sunflower oil, flaxseed oil, and grapeseed oil. Mayonnaise. Cream cheese. Margarine. Butter. Sweets and desserts Plain cakes and cookies. Cream pies and pies made with allowed  fruits. Pudding. Custard. Fruit gelatin. Sherbet. Popsicles. Ice cream without nuts. Plain hard candy. Honey. Jelly. Molasses. Syrups, including chocolate syrup. Chocolate. Marshmallows. Gumdrops. Seasoning and other foods Bouillon. Broth. Cream soups made from allowed foods. Strained soup. Casseroles made with allowed foods. Ketchup. Mild mustard. Mild salad dressings. Plain gravies. Vinegar. Spices in moderation. Salt. Sugar. What foods are not allowed? The items listed below may not be a complete list. Talk with your dietitian about what dietary choices are best for you. Grains Whole wheat and whole grain breads and crackers. Multigrain breads and crackers. Rye bread. Whole grain or multigrain cereals. Cereals with nuts, raisins, or coconut. Bran. Coarse wheat cereals. Granola. High-fiber cereals. Cornmeal or corn bread. Whole grain pasta. Wild or brown rice. Quinoa. Popcorn. Buckwheat. Wheat germ. Vegetables Potato skins. Raw or undercooked vegetables. All beans and bean sprouts. Cooked greens. Corn. Peas. Cabbage. Beets. Broccoli. Brussels sprouts. Cauliflower. Mushrooms. Onions. Peppers. Parsnips. Okra. Sauerkraut. Fruit Raw or dried fruit. Berries. Fruit juice with pulp. Prune juice. Meats and other protein foods Tough, fibrous meats with gristle. Fatty meat. Poultry with skin. Fried meat, Sales executive, or fish. Deli or lunch meats. Sausage, bacon, and hot dogs. Nuts and chunky nut butter. Dried peas, beans, and lentils. Dairy Yogurt with fruit, nuts, chocolate, or granola mix-ins. Beverages Caffeinated coffee and teas. Fats and oils Avocado. Coconut. Sweets and desserts Desserts, cookies, or candies that contain nuts or coconut. Dried fruit. Jams and preserves with seeds. Marmalade. Any dessert made with fruits or grains that are not allowed. Seasoning and other foods Corn tortilla chips. Soups made with vegetables or grains that are not allowed. Relish. Horseradish. Angie Fava.  Olives. Summary  Most people on a low-fiber eating plan should eat less than 10 grams of fiber a day. Follow recommendations from your dietitian about how much fiber you should have each day.  Always check food labels to see the dietary fiber content of packaged foods. In general, a low-fiber food will have fewer than 2 grams of fiber per serving.  In general, try to avoid whole grains, raw fruits and vegetables, dried fruit, tough cuts of meat, nuts, and seeds.  Take a vitamin and mineral supplement as told by your health care provider or dietitian. This information is not intended to replace advice given to you by your health care provider. Make sure you discuss any questions you have with your health care provider. Document Revised: 06/13/2018 Document Reviewed: 04/24/2016 Elsevier Patient Education  2020 [employer].

## 2019-05-20 NOTE — H&P (View-Only) (Signed)
Primary Care Physician:  [facility] Primary Gastroenterologist:  Dr. Gala Romney   Chief Complaint  Patient presents with  . Rectal Bleeding    last week was about 10-15 times per day, mixed dark-bright red. Seen at [facility] ED last week. No straining with BM, no constipation    HPI:   Michael Mcdowell is a 29 y.o. male presenting today at the request of the ED due to rectal bleeding. CT abd/pelvis with contrast on 05/14/19 showing mild wall thickening and mucosal enhancement involving sigmoid and rectum. Hgb normal at 13.4.   Onset of rectal bleeding Jan 15th. Dark red blood per rectum. Mainly just blood per rectum, not associated with stool. Gets a gassy feeling with bleeding. Has tenesmus. Will have some rectal bleeding on tissue. No watery stool. Rarely formed bowel movements. Soft stools. Drinking more water, decreasing sodas. Used to drink mtn dew one per day but now avoiding this. No ETOH since then. Prior to last week, was taking Goody powders for headaches about once per week. No longer taking for the past week. Now just taking tylenol. No significant abdominal pain unless holding need to have BM too long. Moreso rumbly, gassy feeling. Appetite is good. No weight loss. Has some bloating.   Past Medical History:  Diagnosis Date  . Substance abuse in remission [facility])     Past Surgical History:  Procedure Laterality Date  . Tyhroglossal duct cyst      No current outpatient medications on file.   No current facility-administered medications for this visit.    Allergies as of 05/20/2019 - Review Complete 05/20/2019  Allergen Reaction Noted  . Doxycycline Other (See Comments) 08/31/2014    Family History  Problem Relation Age of Onset  . Cancer Paternal Grandmother   . Colon cancer Neg Hx   . Colon polyps Neg Hx   . Inflammatory bowel disease Neg Hx     Social History   Socioeconomic History  . Marital status: Single    Spouse name: Not on file  . Number  of children: Not on file  . Years of education: Not on file  . Highest education level: Not on file  Occupational History  . Not on file  Tobacco Use  . Smoking status: Former Smoker    Types: Cigarettes  . Smokeless tobacco: Former Systems developer    Types: Chew  . Tobacco comment: no cigarettes in 4 years,   Substance and Sexual Activity  . Alcohol use: Not Currently    Comment: No ETOH since January   . Drug use: Not Currently    Comment: pain pills 5 years ago, none since then.   . Sexual activity: Not on file  Other Topics Concern  . Not on file  Social History Narrative  . Not on file   Social Determinants of Health   Financial Resource Strain:   . Difficulty of Paying Living Expenses:   Food Insecurity:   . Worried About Charity fundraiser in the Last Year:   . Arboriculturist in the Last Year:   Transportation Needs:   . Film/video editor (Medical):   Marland Kitchen Lack of Transportation (Non-Medical):   Physical Activity:   . Days of Exercise per Week:   . Minutes of Exercise per Session:   Stress:   . Feeling of Stress :   Social Connections:   . Frequency of Communication with Friends and Family:   . Frequency of Social  Gatherings with Friends and Family:   . Attends Religious Services:   . Active Member of Clubs or Organizations:   . Attends Archivist Meetings:   Marland Kitchen Marital Status:   Intimate Partner Violence:   . Fear of Current or Ex-Partner:   . Emotionally Abused:   Marland Kitchen Physically Abused:   . Sexually Abused:     Review of Systems: Gen: Denies any fever, chills, fatigue, weight loss, lack of appetite.  CV: Denies chest pain, heart palpitations, peripheral edema, syncope.  Resp: Denies shortness of breath at rest or with exertion. Denies wheezing or cough.  GI: see HPI GU : Denies urinary burning, urinary frequency, urinary hesitancy MS: Denies joint pain, muscle weakness, cramps, or limitation of movement.  Derm: Denies rash, itching, dry skin Psych:  Denies depression, anxiety, memory loss, and confusion Heme: see HPI  Physical Exam: BP (!) 141/78   Pulse 78   Temp (!) 97.1 F (36.2 C) (Oral)   Ht 5' 7"  (1.702 m)   Wt 158 lb 12.8 oz (72 kg)   BMI 24.87 kg/m  General:   Alert and oriented. Pleasant and cooperative. Well-nourished and well-developed.  Head:  Normocephalic and atraumatic. Eyes:  Without icterus, sclera clear and conjunctiva pink.  Ears:  Normal auditory acuity. Mouth:  Mask in place Lungs:  Clear to auscultation bilaterally. No wheezes, rales, or rhonchi. No distress.  Heart:  S1, S2 present without murmurs appreciated.  Abdomen:  +BS, soft, non-tender and non-distended. No HSM noted. No guarding or rebound. No masses appreciated.  Rectal:  Deferred  Msk:  Symmetrical without gross deformities. Normal posture. Extremities:  Without edema. Neurologic:  Alert and  oriented x4;  grossly normal neurologically. Skin:  Intact without significant lesions or rashes. Psych:  Alert and cooperative. Normal mood and affect.  ASSESSMENT: Michael Mcdowell is a 29 y.o. male presenting today with history of rectal bleeding and tenesmus since January, with findings on CT of mild wall thickening involving sigmoid and rectum. He denies any diarrhea, Hgb is stable, and there is low concern for infectious process due to clinical presentation. As of note, he endorses Goody powders routinely up until last week; he is now avoiding all NSAIDs.   Discussed need for diagnostic colonoscopy in near future. If diarrhea develops, he is to call. In interim, follow low-residue diet.    PLAN:  Proceed with TCS with Dr. Gala Romney in near future: the risks, benefits, and alternatives have been discussed with the patient in detail. The patient states understanding and desires to proceed. Propofol  Low-residue diet provided  Continue to avoid all forms of NSAIDs  Call if any diarrhea or worsening of symptoms   Annitta Needs, PhD,  ANP-BC [facility]

## 2019-05-20 NOTE — Progress Notes (Signed)
Primary Care Physician:  [facility] Primary Gastroenterologist:  Dr. Gala Romney   Chief Complaint  Patient presents with  . Rectal Bleeding    last week was about 10-15 times per day, mixed dark-bright red. Seen at [facility] ED last week. No straining with BM, no constipation    HPI:   Michael Mcdowell is a 29 y.o. male presenting today at the request of the ED due to rectal bleeding. CT abd/pelvis with contrast on 05/14/19 showing mild wall thickening and mucosal enhancement involving sigmoid and rectum. Hgb normal at 13.4.   Onset of rectal bleeding Jan 15th. Dark red blood per rectum. Mainly just blood per rectum, not associated with stool. Gets a gassy feeling with bleeding. Has tenesmus. Will have some rectal bleeding on tissue. No watery stool. Rarely formed bowel movements. Soft stools. Drinking more water, decreasing sodas. Used to drink mtn dew one per day but now avoiding this. No ETOH since then. Prior to last week, was taking Goody powders for headaches about once per week. No longer taking for the past week. Now just taking tylenol. No significant abdominal pain unless holding need to have BM too long. Moreso rumbly, gassy feeling. Appetite is good. No weight loss. Has some bloating.   Past Medical History:  Diagnosis Date  . Substance abuse in remission [facility])     Past Surgical History:  Procedure Laterality Date  . Tyhroglossal duct cyst      No current outpatient medications on file.   No current facility-administered medications for this visit.    Allergies as of 05/20/2019 - Review Complete 05/20/2019  Allergen Reaction Noted  . Doxycycline Other (See Comments) 08/31/2014    Family History  Problem Relation Age of Onset  . Cancer Paternal Grandmother   . Colon cancer Neg Hx   . Colon polyps Neg Hx   . Inflammatory bowel disease Neg Hx     Social History   Socioeconomic History  . Marital status: Single    Spouse name: Not on file  . Number  of children: Not on file  . Years of education: Not on file  . Highest education level: Not on file  Occupational History  . Not on file  Tobacco Use  . Smoking status: Former Smoker    Types: Cigarettes  . Smokeless tobacco: Former Systems developer    Types: Chew  . Tobacco comment: no cigarettes in 4 years,   Substance and Sexual Activity  . Alcohol use: Not Currently    Comment: No ETOH since January   . Drug use: Not Currently    Comment: pain pills 5 years ago, none since then.   . Sexual activity: Not on file  Other Topics Concern  . Not on file  Social History Narrative  . Not on file   Social Determinants of Health   Financial Resource Strain:   . Difficulty of Paying Living Expenses:   Food Insecurity:   . Worried About Charity fundraiser in the Last Year:   . Arboriculturist in the Last Year:   Transportation Needs:   . Film/video editor (Medical):   Marland Kitchen Lack of Transportation (Non-Medical):   Physical Activity:   . Days of Exercise per Week:   . Minutes of Exercise per Session:   Stress:   . Feeling of Stress :   Social Connections:   . Frequency of Communication with Friends and Family:   . Frequency of Social  Gatherings with Friends and Family:   . Attends Religious Services:   . Active Member of Clubs or Organizations:   . Attends Archivist Meetings:   Marland Kitchen Marital Status:   Intimate Partner Violence:   . Fear of Current or Ex-Partner:   . Emotionally Abused:   Marland Kitchen Physically Abused:   . Sexually Abused:     Review of Systems: Gen: Denies any fever, chills, fatigue, weight loss, lack of appetite.  CV: Denies chest pain, heart palpitations, peripheral edema, syncope.  Resp: Denies shortness of breath at rest or with exertion. Denies wheezing or cough.  GI: see HPI GU : Denies urinary burning, urinary frequency, urinary hesitancy MS: Denies joint pain, muscle weakness, cramps, or limitation of movement.  Derm: Denies rash, itching, dry skin Psych:  Denies depression, anxiety, memory loss, and confusion Heme: see HPI  Physical Exam: BP (!) 141/78   Pulse 78   Temp (!) 97.1 F (36.2 C) (Oral)   Ht 5' 7"  (1.702 m)   Wt 158 lb 12.8 oz (72 kg)   BMI 24.87 kg/m  General:   Alert and oriented. Pleasant and cooperative. Well-nourished and well-developed.  Head:  Normocephalic and atraumatic. Eyes:  Without icterus, sclera clear and conjunctiva pink.  Ears:  Normal auditory acuity. Mouth:  Mask in place Lungs:  Clear to auscultation bilaterally. No wheezes, rales, or rhonchi. No distress.  Heart:  S1, S2 present without murmurs appreciated.  Abdomen:  +BS, soft, non-tender and non-distended. No HSM noted. No guarding or rebound. No masses appreciated.  Rectal:  Deferred  Msk:  Symmetrical without gross deformities. Normal posture. Extremities:  Without edema. Neurologic:  Alert and  oriented x4;  grossly normal neurologically. Skin:  Intact without significant lesions or rashes. Psych:  Alert and cooperative. Normal mood and affect.  ASSESSMENT: Michael Mcdowell is a 29 y.o. male presenting today with history of rectal bleeding and tenesmus since January, with findings on CT of mild wall thickening involving sigmoid and rectum. He denies any diarrhea, Hgb is stable, and there is low concern for infectious process due to clinical presentation. As of note, he endorses Goody powders routinely up until last week; he is now avoiding all NSAIDs.   Discussed need for diagnostic colonoscopy in near future. If diarrhea develops, he is to call. In interim, follow low-residue diet.    PLAN:  Proceed with TCS with Dr. Gala Romney in near future: the risks, benefits, and alternatives have been discussed with the patient in detail. The patient states understanding and desires to proceed. Propofol  Low-residue diet provided  Continue to avoid all forms of NSAIDs  Call if any diarrhea or worsening of symptoms   Annitta Needs, PhD,  ANP-BC [facility]

## 2019-06-04 ENCOUNTER — Encounter (HOSPITAL_COMMUNITY): Payer: Self-pay

## 2019-06-04 ENCOUNTER — Encounter (HOSPITAL_COMMUNITY)
Admission: RE | Admit: 2019-06-04 | Discharge: 2019-06-04 | Disposition: A | Discharge status: Home / Self Care | Payer: BC Managed Care – PPO | Source: Ambulatory Visit | Attending: Internal Medicine | Admitting: Internal Medicine

## 2019-06-04 ENCOUNTER — Other Ambulatory Visit: Payer: Self-pay

## 2019-06-04 DIAGNOSIS — K5190 Ulcerative colitis, unspecified, without complications: Secondary | ICD-10-CM

## 2019-06-04 HISTORY — DX: Ulcerative colitis, unspecified, without complications: K51.90

## 2019-06-05 ENCOUNTER — Other Ambulatory Visit (HOSPITAL_COMMUNITY)
Admission: RE | Admit: 2019-06-05 | Discharge: 2019-06-05 | Disposition: A | Discharge status: Home / Self Care | Payer: BC Managed Care – PPO | Source: Ambulatory Visit | Attending: Internal Medicine | Admitting: Internal Medicine

## 2019-06-05 DIAGNOSIS — Z20822 Contact with and (suspected) exposure to covid-19: Secondary | ICD-10-CM | POA: Diagnosis not present

## 2019-06-05 DIAGNOSIS — Z01812 Encounter for preprocedural laboratory examination: Secondary | ICD-10-CM | POA: Insufficient documentation

## 2019-06-06 LAB — SARS CORONAVIRUS 2 (TAT 6-24 HRS): SARS Coronavirus 2: NEGATIVE

## 2019-06-08 ENCOUNTER — Encounter (HOSPITAL_COMMUNITY): Payer: Self-pay | Admitting: Internal Medicine

## 2019-06-08 ENCOUNTER — Encounter (HOSPITAL_COMMUNITY)
Admission: RE | Disposition: A | Discharge status: Home / Self Care | Payer: Self-pay | Source: Home / Self Care | Attending: Internal Medicine

## 2019-06-08 ENCOUNTER — Ambulatory Visit (HOSPITAL_COMMUNITY): Payer: BC Managed Care – PPO | Admitting: Anesthesiology

## 2019-06-08 ENCOUNTER — Ambulatory Visit (HOSPITAL_COMMUNITY)
Admission: RE | Admit: 2019-06-08 | Discharge: 2019-06-08 | Disposition: A | Discharge status: Home / Self Care | Payer: BC Managed Care – PPO | Attending: Internal Medicine | Admitting: Internal Medicine

## 2019-06-08 DIAGNOSIS — K921 Melena: Secondary | ICD-10-CM

## 2019-06-08 DIAGNOSIS — R197 Diarrhea, unspecified: Secondary | ICD-10-CM | POA: Insufficient documentation

## 2019-06-08 DIAGNOSIS — K6289 Other specified diseases of anus and rectum: Secondary | ICD-10-CM | POA: Diagnosis not present

## 2019-06-08 DIAGNOSIS — K5190 Ulcerative colitis, unspecified, without complications: Secondary | ICD-10-CM | POA: Insufficient documentation

## 2019-06-08 DIAGNOSIS — K529 Noninfective gastroenteritis and colitis, unspecified: Secondary | ICD-10-CM | POA: Diagnosis not present

## 2019-06-08 DIAGNOSIS — Z87891 Personal history of nicotine dependence: Secondary | ICD-10-CM | POA: Diagnosis not present

## 2019-06-08 DIAGNOSIS — R933 Abnormal findings on diagnostic imaging of other parts of digestive tract: Secondary | ICD-10-CM | POA: Diagnosis not present

## 2019-06-08 DIAGNOSIS — Z881 Allergy status to other antibiotic agents status: Secondary | ICD-10-CM | POA: Diagnosis not present

## 2019-06-08 HISTORY — PX: BIOPSY: SHX5522

## 2019-06-08 HISTORY — PX: COLONOSCOPY WITH PROPOFOL: SHX5780

## 2019-06-08 SURGERY — COLONOSCOPY WITH PROPOFOL
Anesthesia: General

## 2019-06-08 MED ORDER — STERILE WATER FOR IRRIGATION IR SOLN
Status: DC | PRN
  Administered 2019-06-08: 13:00:00 100 mL

## 2019-06-08 MED ORDER — LACTATED RINGERS IV SOLN: Freq: Once | INTRAVENOUS | Status: AC

## 2019-06-08 MED ORDER — LIDOCAINE HCL (CARDIAC) PF 100 MG/5ML IV SOSY
PREFILLED_SYRINGE | INTRAVENOUS | Status: DC | PRN
  Administered 2019-06-08: 50 mg via INTRATRACHEAL

## 2019-06-08 MED ORDER — CHLORHEXIDINE GLUCONATE CLOTH 2 % EX PADS: 6.0000 | MEDICATED_PAD | Freq: Once | CUTANEOUS | Status: DC

## 2019-06-08 MED ORDER — PROPOFOL 10 MG/ML IV BOLUS
INTRAVENOUS | Status: DC | PRN
  Administered 2019-06-08: 100 ug via INTRAVENOUS

## 2019-06-08 MED ORDER — LACTATED RINGERS IV SOLN: INTRAVENOUS | Status: DC | PRN

## 2019-06-08 MED ORDER — PROPOFOL 500 MG/50ML IV EMUL
INTRAVENOUS | Status: DC | PRN
  Administered 2019-06-08: 200 ug/kg/min via INTRAVENOUS

## 2019-06-08 NOTE — Transfer of Care (Signed)
Immediate Anesthesia Transfer of Care Note  Patient: Michael Mcdowell  Procedure(s) Performed: COLONOSCOPY WITH PROPOFOL (N/A ) BIOPSY  Patient Location: PACU  Anesthesia Type:General  Level of Consciousness: awake, alert  and oriented  Airway & Oxygen Therapy: Patient Spontanous Breathing  Post-op Assessment: Report given to RN, Post -op Vital signs reviewed and stable and Patient moving all extremities X 4  Post vital signs: Reviewed and stable  Last Vitals:  Vitals Value Taken Time  BP 114/49 06/08/19 1325  Temp    Pulse 74 06/08/19 1327  Resp 12 06/08/19 1327  SpO2 100 % 06/08/19 1327  Vitals shown include unvalidated device data.  Last Pain:  Vitals:   06/08/19 1300  TempSrc:   PainSc: 0-No pain         Complications: No apparent anesthesia complications

## 2019-06-08 NOTE — Interval H&P Note (Signed)
History and Physical Interval Note:  06/08/2019 12:45 PM  Michael Mcdowell  has presented today for surgery, with the diagnosis of rectal bleeding.  The various methods of treatment have been discussed with the patient and family. After consideration of risks, benefits and other options for treatment, the patient has consented to  Procedure(s) with comments: COLONOSCOPY WITH PROPOFOL (N/A) - 12:15pm as a surgical intervention.  The patient's history has been reviewed, patient examined, no change in status, stable for surgery.  I have reviewed the patient's chart and labs.  Questions were answered to the patient's satisfaction.     Michael Mcdowell  Patient essentially unchanged and seen in the office.  Stop Goody powders but has been taking ibuprofen for tooth problem  Essentially no abdominal pain.  Diagnostic colonoscopy today per plan.  The risks, benefits, limitations, alternatives and imponderables have been reviewed with the patient. Questions have been answered. All parties are agreeable.

## 2019-06-08 NOTE — Anesthesia Preprocedure Evaluation (Signed)
Anesthesia Evaluation  Patient identified by MRN, date of birth, ID band Patient awake    Reviewed: Allergy & Precautions, NPO status , Patient's Chart, lab work & pertinent test results  History of Anesthesia Complications Negative for: history of anesthetic complications  Airway Mallampati: II  TM Distance: >3 FB Neck ROM: Full    Dental  (+) Dental Advisory Given, Chipped,  Cavities :   Pulmonary former smoker,    Pulmonary exam normal breath sounds clear to auscultation       Cardiovascular negative cardio ROS Normal cardiovascular exam Rhythm:Regular Rate:Normal     Neuro/Psych negative neurological ROS  negative psych ROS   GI/Hepatic negative GI ROS, (+)     substance abuse (last used - 5 years ago)  ,   Endo/Other  negative endocrine ROS  Renal/GU negative Renal ROS  negative genitourinary   Musculoskeletal  (+) narcotic dependent  Abdominal   Peds negative pediatric ROS (+)  Hematology negative hematology ROS (+)   Anesthesia Other Findings   Reproductive/Obstetrics                             Anesthesia Physical Anesthesia Plan  ASA: I  Anesthesia Plan: General   Post-op Pain Management:    Induction: Intravenous  PONV Risk Score and Plan: 2 and Treatment may vary due to age or medical condition and TIVA  Airway Management Planned: Nasal Cannula and Natural Airway  Additional Equipment:   Intra-op Plan:   Post-operative Plan:   Informed Consent: I have reviewed the patients History and Physical, chart, labs and discussed the procedure including the risks, benefits and alternatives for the proposed anesthesia with the patient or authorized representative who has indicated his/her understanding and acceptance.     Dental advisory given  Plan Discussed with: CRNA and Surgeon  Anesthesia Plan Comments:         Anesthesia Quick Evaluation

## 2019-06-08 NOTE — Anesthesia Postprocedure Evaluation (Signed)
Anesthesia Post Note  Patient: Michael Mcdowell  Procedure(s) Performed: COLONOSCOPY WITH PROPOFOL (N/A ) BIOPSY  Patient location during evaluation: Endoscopy Anesthesia Type: General Level of consciousness: awake and alert Pain management: pain level controlled Vital Signs Assessment: post-procedure vital signs reviewed and stable Respiratory status: spontaneous breathing, nonlabored ventilation, respiratory function stable and patient connected to nasal cannula oxygen Cardiovascular status: blood pressure returned to baseline and stable Postop Assessment: no apparent nausea or vomiting Anesthetic complications: no     Last Vitals:  Vitals:   06/08/19 1345 06/08/19 1352  BP: (!) 118/58 124/73  Pulse: 80 77  Resp: 20 18  Temp:  36.6 C  SpO2: 100% 99%    Last Pain:  Vitals:   06/08/19 1352  TempSrc: Oral  PainSc: 0-No pain                 Talitha Givens

## 2019-06-08 NOTE — Discharge Instructions (Signed)
Colonoscopy Discharge Instructions  Read the instructions outlined below and refer to this sheet in the next few weeks. These discharge instructions provide you with general information on caring for yourself after you leave the hospital. Your doctor may also give you specific instructions. While your treatment has been planned according to the most current medical practices available, unavoidable complications occasionally occur. If you have any problems or questions after discharge, call Dr. Gala Romney at [phone]. ACTIVITY  You may resume your regular activity, but move at a slower pace for the next 24 hours.   Take frequent rest periods for the next 24 hours.   Walking will help get rid of the air and reduce the bloated feeling in your belly (abdomen).   No driving for 24 hours (because of the medicine (anesthesia) used during the test).    Do not sign any important legal documents or operate any machinery for 24 hours (because of the anesthesia used during the test).  NUTRITION  Drink plenty of fluids.   You may resume your normal diet as instructed by your doctor.   Begin with a light meal and progress to your normal diet. Heavy or fried foods are harder to digest and may make you feel sick to your stomach (nauseated).   Avoid alcoholic beverages for 24 hours or as instructed.  MEDICATIONS  You may resume your normal medications unless your doctor tells you otherwise.  WHAT YOU CAN EXPECT TODAY  Some feelings of bloating in the abdomen.   Passage of more gas than usual.   Spotting of blood in your stool or on the toilet paper.  IF YOU HAD POLYPS REMOVED DURING THE COLONOSCOPY:  No aspirin products for 7 days or as instructed.   No alcohol for 7 days or as instructed.   Eat a soft diet for the next 24 hours.  FINDING OUT THE RESULTS OF YOUR TEST Not all test results are available during your visit. If your test results are not back during the visit, make an appointment  with your caregiver to find out the results. Do not assume everything is normal if you have not heard from your caregiver or the medical facility. It is important for you to follow up on all of your test results.  SEEK IMMEDIATE MEDICAL ATTENTION IF:  You have more than a spotting of blood in your stool.   Your belly is swollen (abdominal distention).   You are nauseated or vomiting.   You have a temperature over 101.   You have abdominal pain or discomfort that is severe or gets worse throughout the day.    Ulcerative Colitis, Adult  Ulcerative colitis is long-lasting (chronic) inflammation of the large intestine (colon) and rectum. Sores (ulcers) may also form in these areas. Ulcerative colitis, along with a closely related condition called Crohn's disease, is often referred to as inflammatory bowel disease (IBD). What are the causes? This condition may be caused by increased activity of the immune system in the intestines. The immune system is the system that protects the body against harmful bacteria, viruses, fungi, and other things that can make you sick. The cause of the increased activity of the immune system is not known. What increases the risk? The following factors may make you more likely to develop this condition:  Being 59-71 years old. The risk is also increased for people who are 75-7 years old.  Having a family history of ulcerative colitis.  Being of Jewish descent. What are the signs  or symptoms? Symptoms vary depending on how severe the condition is. Common symptoms include:  Rectal bleeding.  Diarrhea, often with blood or pus in the stool. Other symptoms can include:  Pain or cramping in the abdomen.  Fever.  Fatigue.  Weight loss.  Night sweats.  Rectal pain.  A strong and sudden need to have a bowel movement (bowel urgency).  Nausea.  Loss of appetite.  Anemia.  Yellowing of the skin (jaundice) from liver dysfunction.  Joint pain or  soreness.  Eye irritation.  Skin rashes. Symptoms can range from mild to severe. They may come and go. How is this diagnosed? This condition may be diagnosed based on:  Your symptoms and medical history.  A physical exam.  Tests, including: ? Blood tests and stool tests. ? X-ray. ? A CT scan. ? An MRI. ? Colonoscopy. For this test, a flexible tube is inserted into your anus, and your colon is examined. ? Biopsy. In this test, a tissue sample is taken from your colon and examined under a microscope. How is this treated? Treatment for this condition may include medicines to:  Decrease swelling and inflammation.  Control your immune system.  Treat infections.  Relieve pain.  Control diarrhea. Severe flare-ups may need to be treated at a hospital. Treatment in a hospital may involve:  Resting the bowel. This involves not eating or drinking for a period of time.  Getting medicines through an IV.  Getting fluids and nutrition through: ? An IV. ? A tube that is passed through the nose and into the stomach (nasogastric tube, or NG tube).  Surgery to remove the affected part of the colon. This may be done if other treatments are not helping. This condition increases the risk of colon cancer. Adults with this condition will need to be watched for colon cancer throughout life. Follow these instructions at home: Medicines and vitamins  Take over-the-counter and prescription medicines only as told by your health care provider. Do not take aspirin.  If you were prescribed an antibiotic medicine, take it as told by your health care provider. Do not stop taking the antibiotic even if you start to feel better.  Ask your health care provider if you should take any vitamins or supplements. You may need to take: ? Calcium and vitamin D for bone health. ? Iron to help treat anemia. Lifestyle  Exercise regularly.  Work with your health care provider to manage your condition and  educate yourself about your condition.  Do not use any products that contain nicotine or tobacco, such as cigarettes, e-cigarettes, and chewing tobacco. If you need help quitting, ask your health care provider.  If you drink alcohol: ? Limit how much you use to:  0-1 drink a day for women.  0-2 drinks a day for men. ? Be aware of how much alcohol is in your drink. In the [place] one drink equals one 12 oz bottle of beer (355 mL), one 5 oz glass of wine (148 mL), or one 1 oz glass of hard liquor (44 mL). Eating and drinking  Drink enough fluid to keep your urine pale yellow.  Ask your health care provider about the best diet for you. Follow the diet as told by your health care provider. This may include: ? Avoiding carbonated drinks. ? Avoiding popcorn, vegetable skins, nuts, and other high-fiber foods. ? Avoiding high-fat foods. ? Eating smaller meals more often. ? Limiting your intake of sugary drinks. ? Limiting your caffeine intake.  Follow food safety recommendations as told by your health care provider. This may include making sure you: ? Avoid eating raw or undercooked meat, fish, or eggs. ? Do not eat or drink spoiled or expired foods and drinks.  Keep a food diary. This may help you identify and avoid any foods that trigger your symptoms. General instructions  Wash your hands often with soap and water. If soap and water are not available, use hand sanitizer.  Stay up to date on your vaccinations, including a yearly (annual) flu shot. Ask your health care provider which vaccines you should get.  Follow recommendations from your health care provider for having cancer screening tests. Ulcerative colitis may place you at increased risk for colon cancer.  Keep all follow-up visits as told by your health care provider. This is important. Contact a health care provider if:  Your symptoms do not improve or they get worse with treatment.  You continue to lose weight.  You  have constant cramps or loose stools.  You develop a new skin rash, skin sores, or eye problems.  You have a fever or chills. Get help right away if:  You have bloody diarrhea.  You have severe bleeding from the rectum.  You feel that your heart is racing (tachycardia).  You have severe pain in your abdomen.  Your abdomen swells (abdominal distension).  Your abdomen is tender to the touch.  You vomit. Summary  Ulcerative colitis is long-lasting (chronic) inflammation of the large intestine (colon) and rectum. Sores (ulcers) may also form in these areas.  Follow instructions from your health care provider about medicines, lifestyle changes, and eating and drinking.  Contact your health care provider if symptoms do not improve or they get worse with treatment.  Get help right away if you have severe abdominal pain, abdominal swelling, or severe bleeding from the rectum.  Keep all follow-up visits as told by your health care provider. This is important. This information is not intended to replace advice given to you by your health care provider. Make sure you discuss any questions you have with your health care provider. Document Revised: 12/09/2017 Document Reviewed: 12/11/2017 Elsevier Patient Education  [place].   Your rectum and colon are inflamed consistent with ulcerative colitis, a form of inflammatory bowel disease.  Biopsies were taken.  Begin Entocort 9 mg (3 tablets) daily.  Prescription provided.  Further recommendations to follow pending review of pathology report  Office visit with [place] in 1 month  At patient request, I called [place] at [phone] -rolled to voicemail  Avoid aspirin/NSAID products like ibuprofen is much as possible these types of medications can worsen colitis

## 2019-06-08 NOTE — Op Note (Signed)
[facility] Patient Name: Michael Mcdowell Procedure Date: 06/08/2019 12:10 PM MRN: [id number] Date of Birth: 01/04/1991 Attending Michael Mcdowell: Norvel Richards , Michael Mcdowell CSN: [id number] Age: 29 Admit Type: Outpatient Procedure:                Colonoscopy Indications:              Clinically significant diarrhea of unexplained                            origin, Hematochezia, Abnormal CT of the GI tract Providers:                Norvel Richards, Michael Mcdowell, Rosina Lowenstein, RN, Raphael Gibney, Technician Referring Michael Mcdowell:              Medicines:                Propofol per Anesthesia Complications:            No immediate complications. Estimated Blood Loss:     Estimated blood loss was minimal. Procedure:                Pre-Anesthesia Assessment:                           - Prior to the procedure, a History and Physical                            was performed, and patient medications and                            allergies were reviewed. The patient's tolerance of                            previous anesthesia was also reviewed. The risks                            and benefits of the procedure and the sedation                            options and risks were discussed with the patient.                            All questions were answered, and informed consent                            was obtained. Prior Anticoagulants: The patient has                            taken no previous anticoagulant or antiplatelet                            agents. ASA Grade Assessment: II - A patient with  mild systemic disease. After reviewing the risks                            and benefits, the patient was deemed in                            satisfactory condition to undergo the procedure.                           After obtaining informed consent, the colonoscope                            was passed under direct vision. Throughout the                             procedure, the patient's blood pressure, pulse, and                            oxygen saturations were monitored continuously. The                            CF-[id number] ([id number]) scope was introduced through                            the anus and advanced to the the cecum, identified                            by appendiceal orifice and ileocecal valve. The                            colonoscopy was performed without difficulty. The                            patient tolerated the procedure well. The quality                            of the bowel preparation was adequate. Scope In: 1:01:20 PM Scope Out: 1:17:57 PM Scope Withdrawal Time: 0 hours 11 minutes 32 seconds  Total Procedure Duration: 0 hours 16 minutes 37 seconds  Findings:      The perianal and digital rectal examinations were normal. The rectal       mucosa was diffusely abnormal granularity friability erosions and       exudateno normal; pleat loss of the normal vascular pattern.       Inflammatory changes extended up in the proximal descending segment       where there was a transition to normal-appearing mucosa until the cecum       was reached. In the base the cecum there is a similar patch of eroded       friable granular mucosa. Segmental biopsies of the cecum transverse       descending, sigmoid rectal mucosa were taken and submitted separately. I       elected not to perform a retroflexion in the rectum due to the degree of       inflammation. Impression:               -  Inflamed rectum and left colon with focal                            inflammation of the cecum findings likely represent                            ulcerative colitis in evolution. Status post                            segmental biopsy. Moderate Sedation:      Moderate (conscious) sedation was personally administered by an       anesthesia professional. The following parameters were monitored: oxygen       saturation, heart rate, blood pressure,  respiratory rate, EKG, adequacy       of pulmonary ventilation, and response to care. Recommendation:           - Patient has a contact number available for                            emergencies. The signs and symptoms of potential                            delayed complications were discussed with the                            patient. Return to normal activities tomorrow.                            Written discharge instructions were provided to the                            patient.                           - Advance diet as tolerated. Begin Entocort 9 mg                            daily. Avoid aspirin/NSAID products. Follow-up on                            pathology. Office visit with [place] in 1 month Procedure Code(s):        --- Professional ---                           [phone], Colonoscopy, flexible; diagnostic, including                            collection of specimen(s) by brushing or washing,                            when performed (separate procedure) Diagnosis Code(s):        --- Professional ---                           R19.7, Diarrhea, unspecified  K92.1, Melena (includes Hematochezia)                           R93.3, Abnormal findings on diagnostic imaging of                            other parts of digestive tract CPT copyright 2019 [employer]. All rights reserved. The codes documented in this report are preliminary and upon coder review may  be revised to meet current compliance requirements. Michael Mcdowell. Michael Zobel, Michael Mcdowell Norvel Richards, Michael Mcdowell 06/08/2019 1:30:08 PM This report has been signed electronically. Number of Addenda: 0

## 2019-06-09 LAB — SURGICAL PATHOLOGY

## 2019-06-13 ENCOUNTER — Encounter: Payer: Self-pay | Admitting: Internal Medicine

## 2019-06-15 ENCOUNTER — Telehealth: Payer: Self-pay

## 2019-06-15 NOTE — Telephone Encounter (Signed)
Letter mailed with UC information.

## 2019-06-15 NOTE — Telephone Encounter (Signed)
OV made °

## 2019-06-15 NOTE — Telephone Encounter (Signed)
Per RMR- Send letter to patient.  Send copy of letter with path to referring provider and PCP.   Send info on UC..   should have ov with extender.

## 2019-07-10 ENCOUNTER — Other Ambulatory Visit: Payer: Self-pay

## 2019-07-10 ENCOUNTER — Encounter: Payer: Self-pay | Admitting: Gastroenterology

## 2019-07-10 ENCOUNTER — Telehealth: Payer: Self-pay

## 2019-07-10 ENCOUNTER — Telehealth (INDEPENDENT_AMBULATORY_CARE_PROVIDER_SITE_OTHER): Payer: BC Managed Care – PPO | Admitting: Gastroenterology

## 2019-07-10 DIAGNOSIS — K5190 Ulcerative colitis, unspecified, without complications: Secondary | ICD-10-CM | POA: Insufficient documentation

## 2019-07-10 DIAGNOSIS — K51911 Ulcerative colitis, unspecified with rectal bleeding: Secondary | ICD-10-CM

## 2019-07-10 MED ORDER — MESALAMINE 4 G RE ENEM: 4.0000 g | ENEMA | Freq: Every day | RECTAL | 1 refills | Status: DC

## 2019-07-10 MED ORDER — BUDESONIDE 3 MG PO CPEP: 9.0000 mg | ORAL_CAPSULE | Freq: Every day | ORAL | 2 refills | Status: DC

## 2019-07-10 MED ORDER — MESALAMINE 1.2 G PO TBEC: 4.8000 g | DELAYED_RELEASE_TABLET | Freq: Every day | ORAL | 6 refills | Status: DC

## 2019-07-10 NOTE — Patient Instructions (Addendum)
Continue Entocort 3 capsules each morning until we see you back in the office. We will need to taper this down over time.  I have sent in two medications. One is to help with inducing remission (the enemas) and one you will be taking to keep in remission (Lialda capsules). We need to start both now so there is an overlap of these medications. At some point, we will wean you off Entocort and the enemas while staying only on oral Lialda if possible. You may need to continue enemas or other topical therapy a little bit longer, but we shall see.  Take the Rowasa enema by rectum each evening until we see you again.   Start taking Lialda 4 capsules each morning.  Please call if any concerns or worsening of symptoms. We will see you in 6 weeks!  I enjoyed seeing you again today! As you know, I value our relationship and want to provide genuine, compassionate, and quality care. I welcome your feedback. If you receive a survey regarding your visit,  I greatly appreciate you taking time to fill this out. See you next time!  Annitta Needs, PhD, ANP-BC [facility]      Ulcerative Colitis, Adult  Ulcerative colitis is long-lasting (chronic) inflammation of the large intestine (colon) and rectum. Sores (ulcers) may also form in these areas. Ulcerative colitis, along with a closely related condition called Crohn's disease, is often referred to as inflammatory bowel disease (IBD). What are the causes? This condition may be caused by increased activity of the immune system in the intestines. The immune system is the system that protects the body against harmful bacteria, viruses, fungi, and other things that can make you sick. The cause of the increased activity of the immune system is not known. What increases the risk? The following factors may make you more likely to develop this condition:  Being 52-20 years old. The risk is also increased for people who are 55-55 years old.  Having  a family history of ulcerative colitis.  Being of Jewish descent. What are the signs or symptoms? Symptoms vary depending on how severe the condition is. Common symptoms include:  Rectal bleeding.  Diarrhea, often with blood or pus in the stool. Other symptoms can include:  Pain or cramping in the abdomen.  Fever.  Fatigue.  Weight loss.  Night sweats.  Rectal pain.  A strong and sudden need to have a bowel movement (bowel urgency).  Nausea.  Loss of appetite.  Anemia.  Yellowing of the skin (jaundice) from liver dysfunction.  Joint pain or soreness.  Eye irritation.  Skin rashes. Symptoms can range from mild to severe. They may come and go. How is this diagnosed? This condition may be diagnosed based on:  Your symptoms and medical history.  A physical exam.  Tests, including: ? Blood tests and stool tests. ? X-ray. ? A CT scan. ? An MRI. ? Colonoscopy. For this test, a flexible tube is inserted into your anus, and your colon is examined. ? Biopsy. In this test, a tissue sample is taken from your colon and examined under a microscope. How is this treated? Treatment for this condition may include medicines to:  Decrease swelling and inflammation.  Control your immune system.  Treat infections.  Relieve pain.  Control diarrhea. Severe flare-ups may need to be treated at a hospital. Treatment in a hospital may involve:  Resting the bowel. This involves not eating or drinking for a period of time.  Getting  medicines through an IV.  Getting fluids and nutrition through: ? An IV. ? A tube that is passed through the nose and into the stomach (nasogastric tube, or NG tube).  Surgery to remove the affected part of the colon. This may be done if other treatments are not helping. This condition increases the risk of colon cancer. Adults with this condition will need to be watched for colon cancer throughout life. Follow these instructions at  home: Medicines and vitamins  Take over-the-counter and prescription medicines only as told by your health care provider. Do not take aspirin.  If you were prescribed an antibiotic medicine, take it as told by your health care provider. Do not stop taking the antibiotic even if you start to feel better.  Ask your health care provider if you should take any vitamins or supplements. You may need to take: ? Calcium and vitamin D for bone health. ? Iron to help treat anemia. Lifestyle  Exercise regularly.  Work with your health care provider to manage your condition and educate yourself about your condition.  Do not use any products that contain nicotine or tobacco, such as cigarettes, e-cigarettes, and chewing tobacco. If you need help quitting, ask your health care provider.  If you drink alcohol: ? Limit how much you use to:  0-1 drink a day for women.  0-2 drinks a day for men. ? Be aware of how much alcohol is in your drink. In the [place] one drink equals one 12 oz bottle of beer (355 mL), one 5 oz glass of wine (148 mL), or one 1 oz glass of hard liquor (44 mL). Eating and drinking  Drink enough fluid to keep your urine pale yellow.  Ask your health care provider about the best diet for you. Follow the diet as told by your health care provider. This may include: ? Avoiding carbonated drinks. ? Avoiding popcorn, vegetable skins, nuts, and other high-fiber foods. ? Avoiding high-fat foods. ? Eating smaller meals more often. ? Limiting your intake of sugary drinks. ? Limiting your caffeine intake.  Follow food safety recommendations as told by your health care provider. This may include making sure you: ? Avoid eating raw or undercooked meat, fish, or eggs. ? Do not eat or drink spoiled or expired foods and drinks.  Keep a food diary. This may help you identify and avoid any foods that trigger your symptoms. General instructions  Wash your hands often with soap and water.  If soap and water are not available, use hand sanitizer.  Stay up to date on your vaccinations, including a yearly (annual) flu shot. Ask your health care provider which vaccines you should get.  Follow recommendations from your health care provider for having cancer screening tests. Ulcerative colitis may place you at increased risk for colon cancer.  Keep all follow-up visits as told by your health care provider. This is important. Contact a health care provider if:  Your symptoms do not improve or they get worse with treatment.  You continue to lose weight.  You have constant cramps or loose stools.  You develop a new skin rash, skin sores, or eye problems.  You have a fever or chills. Get help right away if:  You have bloody diarrhea.  You have severe bleeding from the rectum.  You feel that your heart is racing (tachycardia).  You have severe pain in your abdomen.  Your abdomen swells (abdominal distension).  Your abdomen is tender to the touch.  You  vomit. Summary  Ulcerative colitis is long-lasting (chronic) inflammation of the large intestine (colon) and rectum. Sores (ulcers) may also form in these areas.  Follow instructions from your health care provider about medicines, lifestyle changes, and eating and drinking.  Contact your health care provider if symptoms do not improve or they get worse with treatment.  Get help right away if you have severe abdominal pain, abdominal swelling, or severe bleeding from the rectum.  Keep all follow-up visits as told by your health care provider. This is important. This information is not intended to replace advice given to you by your health care provider. Make sure you discuss any questions you have with your health care provider. Document Revised: 12/09/2017 Document Reviewed: 12/11/2017 Elsevier Patient Education  2020 [place].  Ulcerative Colitis, Adult  Ulcerative colitis is long-lasting (chronic)  inflammation of the large intestine (colon) and rectum. Sores (ulcers) may also form in these areas. Ulcerative colitis, along with a closely related condition called Crohn's disease, is often referred to as inflammatory bowel disease (IBD). What are the causes? This condition may be caused by increased activity of the immune system in the intestines. The immune system is the system that protects the body against harmful bacteria, viruses, fungi, and other things that can make you sick. The cause of the increased activity of the immune system is not known. What increases the risk? The following factors may make you more likely to develop this condition:  Being 12-65 years old. The risk is also increased for people who are 72-71 years old.  Having a family history of ulcerative colitis.  Being of Jewish descent. What are the signs or symptoms? Symptoms vary depending on how severe the condition is. Common symptoms include:  Rectal bleeding.  Diarrhea, often with blood or pus in the stool. Other symptoms can include:  Pain or cramping in the abdomen.  Fever.  Fatigue.  Weight loss.  Night sweats.  Rectal pain.  A strong and sudden need to have a bowel movement (bowel urgency).  Nausea.  Loss of appetite.  Anemia.  Yellowing of the skin (jaundice) from liver dysfunction.  Joint pain or soreness.  Eye irritation.  Skin rashes. Symptoms can range from mild to severe. They may come and go. How is this diagnosed? This condition may be diagnosed based on:  Your symptoms and medical history.  A physical exam.  Tests, including: ? Blood tests and stool tests. ? X-ray. ? A CT scan. ? An MRI. ? Colonoscopy. For this test, a flexible tube is inserted into your anus, and your colon is examined. ? Biopsy. In this test, a tissue sample is taken from your colon and examined under a microscope. How is this treated? Treatment for this condition may include medicines  to:  Decrease swelling and inflammation.  Control your immune system.  Treat infections.  Relieve pain.  Control diarrhea. Severe flare-ups may need to be treated at a hospital. Treatment in a hospital may involve:  Resting the bowel. This involves not eating or drinking for a period of time.  Getting medicines through an IV.  Getting fluids and nutrition through: ? An IV. ? A tube that is passed through the nose and into the stomach (nasogastric tube, or NG tube).  Surgery to remove the affected part of the colon. This may be done if other treatments are not helping. This condition increases the risk of colon cancer. Adults with this condition will need to be watched for colon cancer  throughout life. Follow these instructions at home: Medicines and vitamins  Take over-the-counter and prescription medicines only as told by your health care provider. Do not take aspirin.  If you were prescribed an antibiotic medicine, take it as told by your health care provider. Do not stop taking the antibiotic even if you start to feel better.  Ask your health care provider if you should take any vitamins or supplements. You may need to take: ? Calcium and vitamin D for bone health. ? Iron to help treat anemia. Lifestyle  Exercise regularly.  Work with your health care provider to manage your condition and educate yourself about your condition.  Do not use any products that contain nicotine or tobacco, such as cigarettes, e-cigarettes, and chewing tobacco. If you need help quitting, ask your health care provider.  If you drink alcohol: ? Limit how much you use to:  0-1 drink a day for women.  0-2 drinks a day for men. ? Be aware of how much alcohol is in your drink. In the [place] one drink equals one 12 oz bottle of beer (355 mL), one 5 oz glass of wine (148 mL), or one 1 oz glass of hard liquor (44 mL). Eating and drinking  Drink enough fluid to keep your urine pale yellow.  Ask  your health care provider about the best diet for you. Follow the diet as told by your health care provider. This may include: ? Avoiding carbonated drinks. ? Avoiding popcorn, vegetable skins, nuts, and other high-fiber foods. ? Avoiding high-fat foods. ? Eating smaller meals more often. ? Limiting your intake of sugary drinks. ? Limiting your caffeine intake.  Follow food safety recommendations as told by your health care provider. This may include making sure you: ? Avoid eating raw or undercooked meat, fish, or eggs. ? Do not eat or drink spoiled or expired foods and drinks.  Keep a food diary. This may help you identify and avoid any foods that trigger your symptoms. General instructions  Wash your hands often with soap and water. If soap and water are not available, use hand sanitizer.  Stay up to date on your vaccinations, including a yearly (annual) flu shot. Ask your health care provider which vaccines you should get.  Follow recommendations from your health care provider for having cancer screening tests. Ulcerative colitis may place you at increased risk for colon cancer.  Keep all follow-up visits as told by your health care provider. This is important. Contact a health care provider if:  Your symptoms do not improve or they get worse with treatment.  You continue to lose weight.  You have constant cramps or loose stools.  You develop a new skin rash, skin sores, or eye problems.  You have a fever or chills. Get help right away if:  You have bloody diarrhea.  You have severe bleeding from the rectum.  You feel that your heart is racing (tachycardia).  You have severe pain in your abdomen.  Your abdomen swells (abdominal distension).  Your abdomen is tender to the touch.  You vomit. Summary  Ulcerative colitis is long-lasting (chronic) inflammation of the large intestine (colon) and rectum. Sores (ulcers) may also form in these areas.  Follow  instructions from your health care provider about medicines, lifestyle changes, and eating and drinking.  Contact your health care provider if symptoms do not improve or they get worse with treatment.  Get help right away if you have severe abdominal pain, abdominal swelling, or  severe bleeding from the rectum.  Keep all follow-up visits as told by your health care provider. This is important. This information is not intended to replace advice given to you by your health care provider. Make sure you discuss any questions you have with your health care provider. Document Revised: 12/09/2017 Document Reviewed: 12/11/2017 Elsevier Patient Education  [place].

## 2019-07-10 NOTE — Progress Notes (Signed)
Primary Care Physician:  [facility]  Primary GI: Dr. Gala Romney   Patient Location: Home   Provider Location: [facility] office   Reason for Visit: Follow-up    Persons present on the virtual encounter, with roles: Patient and NP     Due to COVID-19, visit was conducted using virtual method.  Visit was requested by patient.  Virtual Visit via MyChart Video Note Due to COVID-19, visit is conducted virtually and was requested by patient.   I connected with Michael Mcdowell on 07/15/19 at 10:00 AM EDT by telephone and verified that I am speaking with the correct person using two identifiers.   I discussed the limitations, risks, security and privacy concerns of performing an evaluation and management service by telephone and the availability of in person appointments. I also discussed with the patient that there may be a patient responsible charge related to this service. The patient expressed understanding and agreed to proceed.  Chief Complaint  Patient presents with  . Follow-up    fu colonoscopy, rectal bleeding, bloody diarrhea     History of Present Illness: Very pleasant 29 year old male with history of rectal bleeding, diarrhea, presenting in follow-up after colonoscopy. Colonoscopy completed April 2021 with inflamed rectum and left colon likely representing UC. Pathology showed cecum with mildly active colitis, descending colon with moderately active colitis, sigmoid with moderately active colitis, and rectum with moderately active colitis.   Recommended Entocort 9 mg once daily. Today was last dose of Entocort. Exact same symptoms. 3 capsules a day.   Multiple stools per day. Last night between 10-5 went 3 times. 2 weeks after colonoscopy seemed to notice some improvement with a little blood here and there and starting to see some better formed stool. Had 2 teeth pulled yesterday so had to be given Ibuprofen. Taking as needed. Now feels back to how he was. No ETOH. No  smoking.     Past Medical History:  Diagnosis Date  . Substance abuse in remission [facility])      Past Surgical History:  Procedure Laterality Date  . BIOPSY  06/08/2019   Procedure: BIOPSY;  Surgeon: Daneil Dolin, MD;  Location: [facility];  Service: Endoscopy;;  . COLONOSCOPY WITH PROPOFOL N/A 06/08/2019   Inflamed rectum and left colon likely representing UC in evolution. Cecal: midly active colitis, de=scending colon with moderately active colitis, sigmoid with moderately active colitis, rectum with moderately active colitis  . Tyhroglossal duct cyst       Current Meds  Medication Sig  . acetaminophen (TYLENOL) 500 MG tablet Take 1,000 mg by mouth as needed for moderate pain.   . budesonide (ENTOCORT EC) 3 MG 24 hr capsule Take 3 capsules (9 mg total) by mouth daily. Each morning  . ibuprofen (ADVIL) 200 MG tablet Take 600 mg by mouth as needed.   . [DISCONTINUED] budesonide (ENTOCORT EC) 3 MG 24 hr capsule Take 9 mg by mouth daily.     Family History  Problem Relation Age of Onset  . Cancer Paternal Grandmother   . Colon cancer Neg Hx   . Colon polyps Neg Hx   . Inflammatory bowel disease Neg Hx     Social History   Socioeconomic History  . Marital status: Single    Spouse name: Not on file  . Number of children: Not on file  . Years of education: Not on file  . Highest education level: Not on file  Occupational History  . Not on file  Tobacco Use  . Smoking status: Former Smoker    Types: Cigarettes  . Smokeless tobacco: Former Systems developer    Types: Chew  . Tobacco comment: no cigarettes in 4 years,   Substance and Sexual Activity  . Alcohol use: Not Currently    Comment: No ETOH since January   . Drug use: Not Currently    Comment: pain pills 5 years ago, none since then.   . Sexual activity: Not on file  Other Topics Concern  . Not on file  Social History Narrative  . Not on file   Social Determinants of Health   Financial Resource Strain:   .  Difficulty of Paying Living Expenses:   Food Insecurity:   . Worried About Charity fundraiser in the Last Year:   . Arboriculturist in the Last Year:   Transportation Needs:   . Film/video editor (Medical):   Marland Kitchen Lack of Transportation (Non-Medical):   Physical Activity:   . Days of Exercise per Week:   . Minutes of Exercise per Session:   Stress:   . Feeling of Stress :   Social Connections:   . Frequency of Communication with Friends and Family:   . Frequency of Social Gatherings with Friends and Family:   . Attends Religious Services:   . Active Member of Clubs or Organizations:   . Attends Archivist Meetings:   Marland Kitchen Marital Status:        Review of Systems: Gen: Denies fever, chills, anorexia. Denies fatigue, weakness, weight loss.  CV: Denies chest pain, palpitations, syncope, peripheral edema, and claudication. Resp: Denies dyspnea at rest, cough, wheezing, coughing up blood, and pleurisy. GI: see HPI Derm: Denies rash, itching, dry skin Psych: Denies depression, anxiety, memory loss, confusion. No homicidal or suicidal ideation.  Heme: Denies bruising, bleeding, and enlarged lymph nodes.  Observations/Objective: No distress. Pleasant and cooperative.   Assessment and Plan: 29 year old male recently diagnosed with UC, initially noting improvement on Entocort 9 mg but now with return of baseline symptoms including looser stool and rectal bleeding. He has taken his last dose of Entocort today.  I am refilling Entocort to extend longer course, adding Rowasa enemas each evening, and starting Lialda 4.8 grams daily. He is to call in the next few weeks with a progress report. May have to escalate therapy if adding topical therapy is not helpful.   Return in 6 weeks for close follow-up.   Follow Up Instructions: SEE AVS   I discussed the assessment and treatment plan with the patient. The patient was provided an opportunity to ask questions and all were  answered. The patient agreed with the plan and demonstrated an understanding of the instructions.   The patient was advised to call back or seek an in-person evaluation if the symptoms worsen or if the condition fails to improve as anticipated.   Annitta Needs, PhD, ANP-BC [facility]

## 2019-07-10 NOTE — Telephone Encounter (Signed)
Orlando Penner [place], you are scheduled for a virtual visit with your provider today.  Just as we do with appointments in the office, we must obtain your consent to participate.  Your consent will be active for this visit and any virtual visit you may have with one of our providers in the next 365 days.  If you have a MyChart account, I can also send a copy of this consent to you electronically.  All virtual visits are billed to your insurance company just like a traditional visit in the office.  As this is a virtual visit, video technology does not allow for your provider to perform a traditional examination.  This may limit your provider's ability to fully assess your condition.  If your provider identifies any concerns that need to be evaluated in person or the need to arrange testing such as labs, EKG, etc, we will make arrangements to do so.  Although advances in technology are sophisticated, we cannot ensure that it will always work on either your end or our end.  If the connection with a video visit is poor, we may have to switch to a telephone visit.  With either a video or telephone visit, we are not always able to ensure that we have a secure connection.   I need to obtain your verbal consent now.   Are you willing to proceed with your visit today?

## 2019-07-10 NOTE — Telephone Encounter (Signed)
Pt agreed to virtual visit.

## 2019-07-15 ENCOUNTER — Encounter: Payer: Self-pay | Admitting: Gastroenterology

## 2019-07-19 ENCOUNTER — Other Ambulatory Visit: Payer: Self-pay

## 2019-07-19 ENCOUNTER — Ambulatory Visit
Admission: EM | Admit: 2019-07-19 | Discharge: 2019-07-19 | Disposition: A | Discharge status: Home / Self Care | Payer: BC Managed Care – PPO | Attending: Emergency Medicine | Admitting: Emergency Medicine

## 2019-07-19 DIAGNOSIS — S80861A Insect bite (nonvenomous), right lower leg, initial encounter: Secondary | ICD-10-CM

## 2019-07-19 DIAGNOSIS — W57XXXA Bitten or stung by nonvenomous insect and other nonvenomous arthropods, initial encounter: Secondary | ICD-10-CM | POA: Diagnosis not present

## 2019-07-19 MED ORDER — AMOXICILLIN 500 MG PO CAPS: 500.0000 mg | ORAL_CAPSULE | Freq: Three times a day (TID) | ORAL | 0 refills | Status: AC

## 2019-07-19 NOTE — ED Triage Notes (Signed)
Pt removed tick on Thursday , no rash present

## 2019-07-19 NOTE — ED Provider Notes (Signed)
[place]   [id number] 07/19/19 Arrival Time: 3818  CC: Tick bite  SUBJECTIVE:  Michael Mcdowell is a 29 y.o. male complains of tick bite to LLE <24 hours that he removed 3 days ago.  States tick was not engorged, but difficult to remove.  Since then has had subjective fever, chills, body aches, neck pain, nausea, 1 episodes of vomiting, and HA.  Denies rash at this time.  Took ibuprofen with temporary relief.  Denies aggravating factors.  Denies hx of tick-borne illness in the past.   Denies dizziness, weakness, or rash.  ROS: As per HPI.  All other pertinent ROS negative.     Past Medical History:  Diagnosis Date  . Substance abuse in remission [facility])    Past Surgical History:  Procedure Laterality Date  . BIOPSY  06/08/2019   Procedure: BIOPSY;  Surgeon: Daneil Dolin, MD;  Location: [facility];  Service: Endoscopy;;  . COLONOSCOPY WITH PROPOFOL N/A 06/08/2019   Inflamed rectum and left colon likely representing UC in evolution. Cecal: midly active colitis, de=scending colon with moderately active colitis, sigmoid with moderately active colitis, rectum with moderately active colitis  . Tyhroglossal duct cyst     Allergies  Allergen Reactions  . Doxycycline Other (See Comments)    Possible reaction.  Pt not sure.   No current facility-administered medications on file prior to encounter.   Current Outpatient Medications on File Prior to Encounter  Medication Sig Dispense Refill  . acetaminophen (TYLENOL) 500 MG tablet Take 1,000 mg by mouth as needed for moderate pain.     . budesonide (ENTOCORT EC) 3 MG 24 hr capsule Take 3 capsules (9 mg total) by mouth daily. Each morning 90 capsule 2  . ibuprofen (ADVIL) 200 MG tablet Take 600 mg by mouth as needed.     . mesalamine (LIALDA) 1.2 g EC tablet Take 4 tablets (4.8 g total) by mouth daily with breakfast. 120 tablet 6  . mesalamine (ROWASA) 4 g enema Place 60 mLs (4 g total) rectally at bedtime. 30 enema 1   Social  History   Socioeconomic History  . Marital status: Single    Spouse name: Not on file  . Number of children: Not on file  . Years of education: Not on file  . Highest education level: Not on file  Occupational History  . Not on file  Tobacco Use  . Smoking status: Former Smoker    Types: Cigarettes  . Smokeless tobacco: Former Systems developer    Types: Chew  . Tobacco comment: no cigarettes in 4 years,   Substance and Sexual Activity  . Alcohol use: Not Currently    Comment: No ETOH since January   . Drug use: Not Currently    Comment: pain pills 5 years ago, none since then.   . Sexual activity: Not on file  Other Topics Concern  . Not on file  Social History Narrative  . Not on file   Social Determinants of Health   Financial Resource Strain:   . Difficulty of Paying Living Expenses:   Food Insecurity:   . Worried About Charity fundraiser in the Last Year:   . Arboriculturist in the Last Year:   Transportation Needs:   . Film/video editor (Medical):   Marland Kitchen Lack of Transportation (Non-Medical):   Physical Activity:   . Days of Exercise per Week:   . Minutes of Exercise per Session:   Stress:   .  Feeling of Stress :   Social Connections:   . Frequency of Communication with Friends and Family:   . Frequency of Social Gatherings with Friends and Family:   . Attends Religious Services:   . Active Member of Clubs or Organizations:   . Attends Archivist Meetings:   Marland Kitchen Marital Status:   Intimate Partner Violence:   . Fear of Current or Ex-Partner:   . Emotionally Abused:   Marland Kitchen Physically Abused:   . Sexually Abused:    Family History  Problem Relation Age of Onset  . Cancer Paternal Grandmother   . Colon cancer Neg Hx   . Colon polyps Neg Hx   . Inflammatory bowel disease Neg Hx     OBJECTIVE: Vitals:   07/19/19 1255  BP: 120/79  Pulse: (!) 107  Resp: 18  Temp: 100.2 F (37.9 C)  SpO2: 97%    General appearance: alert; no distress Head: NCAT Lungs:  clear to auscultation bilaterally Heart: regular rate and rhythm.   Abdomen: soft, nondistended, normal active bowel sounds; nontender to palpation; no guarding  Extremities: no edema Skin: warm and dry; small punctate lesion with mild surrounding erythema to medial proximal LT calf, NTTP, no obvious drainage or bleeding, no tick remnants evident Psychological: alert and cooperative; normal mood and affect  ASSESSMENT & PLAN:  1. Tick bite, initial encounter   2. Insect bite of right lower extremity, initial encounter     Meds ordered this encounter  Medications  . amoxicillin (AMOXIL) 500 MG capsule    Sig: Take 1 capsule (500 mg total) by mouth 3 (three) times daily for 14 days.    Dispense:  42 capsule    Refill:  0    Order Specific Question:   Supervising Provider    Answer:   Raylene Everts [[id number]]    Based on symptoms and hx of tick bite I will cover for possible tick-borne illness.  You may have titers, or blood work done in the future with PCP to check for resolution of tick-borne illness In the future: To prevent tick bites, wear long sleeves, long pants, and light colors. Use insect repellent. Follow the instructions on the bottle. If the tick is biting, do not try to remove it with heat, alcohol, petroleum jelly, or fingernail polish. Use tweezers, curved forceps, or a tick-removal tool to grasp the tick. Gently pull up until the tick lets go. Do not twist or jerk the tick. Do not squeeze or crush the tick. Return here or go to ER if you have any new or worsening symptoms (rash, nausea, vomiting, fever, chills, severe headache, fatigue, abdominal pain, stiff neck, worsening symptoms despite treatment, etc...)    Reviewed expectations re: course of current medical issues. Questions answered. Outlined signs and symptoms indicating need for more acute intervention. Patient verbalized understanding. After Visit Summary given.   Lestine Box, PA-C 07/19/19  [id number]

## 2019-07-19 NOTE — Discharge Instructions (Signed)
Based on symptoms and hx of tick bite I will cover for possible tick-borne illness.  You may have titers, or blood work done in the future with PCP to check for resolution of tick-borne illness In the future: To prevent tick bites, wear long sleeves, long pants, and light colors. Use insect repellent. Follow the instructions on the bottle. If the tick is biting, do not try to remove it with heat, alcohol, petroleum jelly, or fingernail polish. Use tweezers, curved forceps, or a tick-removal tool to grasp the tick. Gently pull up until the tick lets go. Do not twist or jerk the tick. Do not squeeze or crush the tick. Return here or go to ER if you have any new or worsening symptoms (rash, nausea, vomiting, fever, chills, severe headache, fatigue, abdominal pain, stiff neck, worsening symptoms despite treatment, etc...)

## 2019-07-24 ENCOUNTER — Telehealth: Payer: Self-pay | Admitting: *Deleted

## 2019-07-24 ENCOUNTER — Telehealth: Payer: Self-pay | Admitting: Internal Medicine

## 2019-07-24 DIAGNOSIS — K51011 Ulcerative (chronic) pancolitis with rectal bleeding: Secondary | ICD-10-CM

## 2019-07-24 MED ORDER — PREDNISONE 10 MG PO TABS: ORAL_TABLET | ORAL | 0 refills | Status: DC

## 2019-07-24 NOTE — Telephone Encounter (Signed)
Pt came to front window asking to speak with AB. I told him AB is with her morning patients. He said he would wait. I asked if it was about a medication and he said it's about his whole situation and the medication isn't working. He said he would wait for the nurse.

## 2019-07-24 NOTE — Addendum Note (Signed)
Addended by: Annitta Needs on: 07/24/2019 10:28 AM   Modules accepted: Orders

## 2019-07-24 NOTE — Telephone Encounter (Signed)
Called pt and he is aware that lab orders have been placed and sent to [facility].  Scheduled fu ov for 08/04/19 with EG (first and only available appt that week).  Pt is aware that it is virtual and says that works best for him.

## 2019-07-24 NOTE — Telephone Encounter (Signed)
See other phone note

## 2019-07-24 NOTE — Telephone Encounter (Signed)
I personally spoke with patient. No real improvement on entocort. Started Lialda over a week ago. Unable to tolerate enemas due to rectal discomfort and feeling he has hemorrhoids. NO diarrhea. Persistent rectal bleeding and tenesmus, multiple per day. No fever/chills. Abdominal cramping at baseline and not worsened.   Discussed need to present to ED if abdominal pain, fever, chills, worsening bleeding. He is to call if any diarrhea, and we will need to do stool studies to rule out infection.   As he is not responding to current regimen and fitting in a moderate UC scenario, we need to escalate therapy. However, needs labs done prior to doing this. He would be a good candidate for Humira and has no concerns with injections.    We need to do the following:  1. I am sending in prednisone 40 mg daily to start taking now. He will be picking this up. He has had no improvement with entocort. Stop entocort.   2. Clinically with moderate UC. We need to prepare for likely biologic at this point due to his symptoms. To do this, I have ordered:  Hep B surface antigen, Hep B surface antibody, Hep b Core antibody, Hep C antibody, TB gold assay, anti-EBV and anti-CMV antibodies (to assess evidence of prior infection and risk of reactivation), varicella zoster IgG (documenting immunity).   Also ordered: CBC, CMP, sed rate, CRP.  I'm not sure if they need to be released or not.    3. Needs virtual visit in next 2 weeks to discuss escalating therapy.   He was informed of signs/symptoms that would prompt ED evaluation.

## 2019-07-24 NOTE — Telephone Encounter (Signed)
Spoke to pt.  He said that he was on Budesonide and we changed his medication to Mesalamine.  He said that neither medications worked for him.  He says that his food is going straight through him.  When he goes to the bathroom, it is just blood.  He says that this has been going on for 5 months.  He says he can't do enemas because he has hemorrhoids.  He says he has lost weight.  I weighed him and it was 142.6 lbs.  He uses [facility] Dr Nonnie Done

## 2019-07-27 ENCOUNTER — Telehealth: Payer: Self-pay | Admitting: Emergency Medicine

## 2019-07-27 DIAGNOSIS — Z1159 Encounter for screening for other viral diseases: Secondary | ICD-10-CM | POA: Diagnosis not present

## 2019-07-27 DIAGNOSIS — K51011 Ulcerative (chronic) pancolitis with rectal bleeding: Secondary | ICD-10-CM | POA: Diagnosis not present

## 2019-07-27 DIAGNOSIS — Z79899 Other long term (current) drug therapy: Secondary | ICD-10-CM | POA: Diagnosis not present

## 2019-07-27 NOTE — Telephone Encounter (Signed)
Noted. Can he provide vaccination records? Is there any way we can figure out what codes will cover the Hep B? Definitely needs this for sure. Hope he is doing better.

## 2019-07-27 NOTE — Telephone Encounter (Signed)
Linda from [facility] called and stated the dx codes that were given for the hep b test and the antibodies for chicken pocks ar not covered by pt insurance. Gave her dx code z11.59 and z79.899 neither of the codes worked to cover test. And the test cost over $300 without insurance. Pt was not able to get those labs drawn

## 2019-07-27 NOTE — Telephone Encounter (Signed)
Noted  

## 2019-07-28 LAB — EPSTEIN-BARR VIRUS VCA ANTIBODY PANEL
EBV NA IgG: <18 U/mL
EBV VCA IgG: 112 U/mL — ABNORMAL HIGH
EBV VCA IgM: <36 U/mL

## 2019-07-28 LAB — COMPLETE METABOLIC PANEL WITH GFR
AG Ratio: 1.3 (calc) (ref 1.0–2.5)
ALT: 11 U/L (ref 9–46)
AST: 12 U/L (ref 10–40)
Albumin: 4 g/dL (ref 3.6–5.1)
Alkaline phosphatase (APISO): 47 U/L (ref 36–130)
BUN: 10 mg/dL (ref 7–25)
CO2: 29 mmol/L (ref 20–32)
Calcium: 9.6 mg/dL (ref 8.6–10.3)
Chloride: 103 mmol/L (ref 98–110)
Creat: 0.84 mg/dL (ref 0.60–1.35)
GFR, Est African American: 138 mL/min/{1.73_m2} (ref >=60)
GFR, Est Non African American: 119 mL/min/{1.73_m2} (ref >=60)
Globulin: 3.1 g/dL (calc) (ref 1.9–3.7)
Glucose, Bld: 104 mg/dL (ref 65–139)
Potassium: 4 mmol/L (ref 3.5–5.3)
Sodium: 141 mmol/L (ref 135–146)
Total Bilirubin: 0.3 mg/dL (ref 0.2–1.2)
Total Protein: 7.1 g/dL (ref 6.1–8.1)

## 2019-07-28 LAB — CBC WITH DIFFERENTIAL/PLATELET
Absolute Monocytes: 1018 cells/uL — ABNORMAL HIGH (ref 200–950)
Basophils Absolute: 32 cells/uL (ref 0–200)
Basophils Relative: 0.3 %
Eosinophils Absolute: 53 cells/uL (ref 15–500)
Eosinophils Relative: 0.5 %
HCT: 35.5 % — ABNORMAL LOW (ref 38.5–50.0)
Hemoglobin: 12 g/dL — ABNORMAL LOW (ref 13.2–17.1)
Lymphs Abs: 1102 cells/uL (ref 850–3900)
MCH: 30.2 pg (ref 27.0–33.0)
MCHC: 33.8 g/dL (ref 32.0–36.0)
MCV: 89.4 fL (ref 80.0–100.0)
MPV: 10.8 fL (ref 7.5–12.5)
Monocytes Relative: 9.6 %
Neutro Abs: 8395 cells/uL — ABNORMAL HIGH (ref 1500–7800)
Neutrophils Relative %: 79.2 %
Platelets: 350 10*3/uL (ref 140–400)
RBC: 3.97 10*6/uL — ABNORMAL LOW (ref 4.20–5.80)
RDW: 11.1 % (ref 11.0–15.0)
Total Lymphocyte: 10.4 %
WBC: 10.6 10*3/uL (ref 3.8–10.8)

## 2019-07-28 LAB — SEDIMENTATION RATE: Sed Rate: 31 mm/h — ABNORMAL HIGH (ref 0–15)

## 2019-07-28 LAB — HEPATITIS C ANTIBODY
Hepatitis C Ab: NONREACTIVE
SIGNAL TO CUT-OFF: 0.11 (ref <1.00)

## 2019-07-28 LAB — C-REACTIVE PROTEIN: CRP: 51.6 mg/L — ABNORMAL HIGH (ref <8.0)

## 2019-07-28 LAB — CYTOMEGALOVIRUS ANTIBODY, IGG: Cytomegalovirus Ab-IgG: >10 U/mL — ABNORMAL HIGH

## 2019-07-28 LAB — HEPATITIS B CORE ANTIBODY, TOTAL: Hep B Core Total Ab: NONREACTIVE

## 2019-07-28 LAB — HEPATITIS B SURFACE ANTIBODY,QUALITATIVE: Hep B S Ab: NONREACTIVE

## 2019-07-28 NOTE — Telephone Encounter (Signed)
Called pt verified name and dob Pt stated he will call his pcp and have them fax over his vaccine records by Friday  Gave pt our fax number  Tried a total of 5 dx codes with [facility], insurance is still refusing to pay and pt will have to pay over $300 Pt stated he is doing better Thanked me for the call

## 2019-07-28 NOTE — Telephone Encounter (Signed)
Thanks

## 2019-08-04 ENCOUNTER — Other Ambulatory Visit: Payer: Self-pay

## 2019-08-04 ENCOUNTER — Telehealth (INDEPENDENT_AMBULATORY_CARE_PROVIDER_SITE_OTHER): Payer: BC Managed Care – PPO | Admitting: Nurse Practitioner

## 2019-08-04 ENCOUNTER — Encounter: Payer: Self-pay | Admitting: Nurse Practitioner

## 2019-08-04 DIAGNOSIS — K51911 Ulcerative colitis, unspecified with rectal bleeding: Secondary | ICD-10-CM | POA: Diagnosis not present

## 2019-08-04 DIAGNOSIS — Z9225 Personal history of immunosupression therapy: Secondary | ICD-10-CM | POA: Diagnosis not present

## 2019-08-04 NOTE — Progress Notes (Signed)
Referring Provider: [facility] Primary Care Physician:  [facility] Primary GI:  Dr. Gala Romney  NOTE: Service was provided via telemedicine and was requested by the patient due to COVID-19 pandemic.  Patient Location: Home  Provider Location: [place] office  Reason for Phone Visit: Follow-up  Persons present on the phone encounter, with roles: Patient, myself (provider), Christ Kick (updated meds and allergies)  Total time (minutes) spent on medical discussion: 20 minutes  Due to COVID-19, visit was conducted using the virtual method noted. Visit was requested by patient.  I connected with Michael Mcdowell on 08/14/19 at  2:30 PM EDT by Video Call and verified that I am speaking with the correct person using two identifiers.   I discussed the limitations, risks, security and privacy concerns of performing an evaluation and management service by telephone and the availability of in person appointments. I also discussed with the patient that there may be a patient responsible charge related to this service. The patient expressed understanding and agreed to proceed.  Chief Complaint  Patient presents with  . Rectal Bleeding  . Abdominal Pain    lower abd    HPI:   Michael Mcdowell is a 29 y.o. male who presents for virtual visit regarding: follow-up after colonoscopy for newly diagnosed ulcerative colitis.  The patient was initially referred to our office after ED evaluation for diarrhea and rectal bleeding.  In the ED he was found to be heme positive.  His last visit in our office was 05/20/2019 where he again confirmed rectal bleeding.  CT of the abdomen and pelvis on 05/14/2019 showed mild wall thickening and mucosal enhancement all involving the sigmoid and rectum, hemoglobin normal at 13.4.  At his last visit he noted dark blood per rectum that started January 15 and not generally associated with stool.  Has a gassy feeling with his rectal bleeding as well  as tenesmus.  Some toilet tissue hematochezia as well.  No watery stools.  Rarely has formed bowel movements.  Typically soft stools.  He is drinking more water and has made dietary changes to eliminate Goody powder and sodas.  No alcohol.  Only using Tylenol at this point due to precautions given by ED.  No significant abdominal pain unless he holds his need for a bowel movement for too long.  Overall just feels a Rumbley, gassy feeling.  No weight loss, appetite good.  Recommended colonoscopy.  Colonoscopy was completed 06/08/2019 which found inflamed rectum and left colon with focal inflammation in the cecum likely representing ulcerative colitis in evolution, status post segmental biopsy.  Surgical pathology confirmed the apparent diagnosis of inflammatory bowel disease with mild to moderate active colitis in the cecum, descending colon, sigmoid colon, rectum.  He was started on Entocort and recommended a follow-up office visit for maintenance therapy.  Today he states he's doing ok overall. It appears his [place] and quantiferon were not covered by insurance.   Still having lower abdominal pain which is with every bowel movement, about every hour. Having at least 10+ stools a day, most of the time he will go during the night as well. Notes his stools are predominantly blood. Stools are loose and watery. May have a little bit of solid stool about once every 1-2 weeks. Prednisone didn't really help. Neither did Entocort or Lialda. Didn't have chickenpox as a child, but states he had the vaccine. Denies N/V, melena, fever, chills. He feels he has had some subjective weight loss (has  had to add 3-4 holes into his belt). Has had some more weakness from where he doesn't really eat normal (when he eats he has to go to the bathroom). Hard to stay hydrated and if he gets dehydrated, he feels lightheaded with fatigue. Drinks Data processing manager. Denies NSAIDs, only takes Tylenol. Denies URI or flu-like symptoms. Denies  loss of sense of taste or smell. The patient has not received COVID-19 vaccination(s). They are not interested in information on COVID vaccine scheduling. Denies chest pain, dyspnea. Denies any other upper or lower GI symptoms.  At the end of his visit he mentioned what he feels may be blood clots in his stools.  A total of 30+ minutes was spent on the patient's care today  Past Medical History:  Diagnosis Date  . Substance abuse in remission [facility])     Past Surgical History:  Procedure Laterality Date  . BIOPSY  06/08/2019   Procedure: BIOPSY;  Surgeon: Daneil Dolin, MD;  [facility];  Service: Endoscopy;;  . COLONOSCOPY WITH PROPOFOL N/A 06/08/2019   Inflamed rectum and left colon likely representing UC in evolution. Cecal: midly active colitis, de=scending colon with moderately active colitis, sigmoid with moderately active colitis, rectum with moderately active colitis  . Tyhroglossal duct cyst      Current Outpatient Medications  Medication Sig Dispense Refill  . acetaminophen (TYLENOL) 500 MG tablet Take 1,000 mg by mouth as needed for moderate pain.     Marland Kitchen AMOXICILLIN PO Take by mouth. For tick bite. Takes when he remembers    . mesalamine (LIALDA) 1.2 g EC tablet Take 4 tablets (4.8 g total) by mouth daily with breakfast. 120 tablet 6  . predniSONE (DELTASONE) 10 MG tablet Take 4 tablets (40 mg total) by mouth daily with breakfast for 7 days, THEN 3 tablets (30 mg total) daily with breakfast for 7 days, THEN 2 tablets (20 mg total) daily with breakfast for 7 days, THEN 1 tablet (10 mg total) daily with breakfast for 7 days. 100 tablet 0   No current facility-administered medications for this visit.    Allergies as of 08/04/2019 - Review Complete 08/04/2019  Allergen Reaction Noted  . Doxycycline Other (See Comments) 08/31/2014    Family History  Problem Relation Age of Onset  . Cancer Paternal Grandmother   . Colon cancer Neg Hx   . Colon polyps Neg Hx   .  Inflammatory bowel disease Neg Hx     Social History   Socioeconomic History  . Marital status: Single    Spouse name: Not on file  . Number of children: Not on file  . Years of education: Not on file  . Highest education level: Not on file  Occupational History  . Not on file  Tobacco Use  . Smoking status: Former Smoker    Types: Cigarettes  . Smokeless tobacco: Former Systems developer    Types: Chew  . Tobacco comment: no cigarettes in 4 years,   Vaping Use  . Vaping Use: Never used  Substance and Sexual Activity  . Alcohol use: Yes    Comment: rare  . Drug use: Not Currently    Comment: pain pills 5 years ago, none since then.   . Sexual activity: Not on file  Other Topics Concern  . Not on file  Social History Narrative  . Not on file   Social Determinants of Health   Financial Resource Strain:   . Difficulty of Paying Living Expenses:   Food Insecurity:   .  Worried About Charity fundraiser in the Last Year:   . Arboriculturist in the Last Year:   Transportation Needs:   . Film/video editor (Medical):   Marland Kitchen Lack of Transportation (Non-Medical):   Physical Activity:   . Days of Exercise per Week:   . Minutes of Exercise per Session:   Stress:   . Feeling of Stress :   Social Connections:   . Frequency of Communication with Friends and Family:   . Frequency of Social Gatherings with Friends and Family:   . Attends Religious Services:   . Active Member of Clubs or Organizations:   . Attends Archivist Meetings:   Marland Kitchen Marital Status:     Review of Systems: Review of Systems  Constitutional: Negative for chills, fever, malaise/fatigue and weight loss.  HENT: Negative for congestion and sore throat.   Respiratory: Negative for cough and shortness of breath.   Cardiovascular: Negative for chest pain and palpitations.  Gastrointestinal: Negative for abdominal pain, blood in stool, diarrhea, melena, nausea and vomiting.  Musculoskeletal: Negative for joint  pain and myalgias.  Skin: Negative for rash.  Neurological: Negative for dizziness and weakness.  Endo/Heme/Allergies: Does not bruise/bleed easily.  Psychiatric/Behavioral: Negative for depression. The patient is not nervous/anxious.   All other systems reviewed and are negative.   Physical Exam: Note: limited exam due to virtual visit There were no vitals taken for this visit. Physical Exam Nursing note reviewed.  Constitutional:      General: He is not in acute distress.    Appearance: Normal appearance. He is not ill-appearing, toxic-appearing or diaphoretic.  HENT:     Head: Normocephalic and atraumatic.     Nose: No congestion or rhinorrhea.  Eyes:     General: No scleral icterus. Pulmonary:     Effort: Pulmonary effort is normal.  Abdominal:     General: There is no distension.     Palpations: There is no hepatomegaly or splenomegaly.  Skin:    Coloration: Skin is not jaundiced.     Findings: No bruising.  Neurological:     General: No focal deficit present.     Mental Status: He is alert and oriented to person, place, and time. Mental status is at baseline.  Psychiatric:        Mood and Affect: Mood normal.        Behavior: Behavior normal.        Thought Content: Thought content normal.

## 2019-08-07 DIAGNOSIS — K529 Noninfective gastroenteritis and colitis, unspecified: Secondary | ICD-10-CM | POA: Diagnosis not present

## 2019-08-07 DIAGNOSIS — K51911 Ulcerative colitis, unspecified with rectal bleeding: Secondary | ICD-10-CM | POA: Diagnosis not present

## 2019-08-07 DIAGNOSIS — Z1159 Encounter for screening for other viral diseases: Secondary | ICD-10-CM | POA: Diagnosis not present

## 2019-08-07 DIAGNOSIS — Z79899 Other long term (current) drug therapy: Secondary | ICD-10-CM | POA: Diagnosis not present

## 2019-08-10 LAB — CBC WITH DIFFERENTIAL/PLATELET
Absolute Monocytes: 552 cells/uL (ref 200–950)
Basophils Absolute: 35 cells/uL (ref 0–200)
Basophils Relative: 0.3 %
Eosinophils Absolute: 23 cells/uL (ref 15–500)
Eosinophils Relative: 0.2 %
HCT: 37.8 % — ABNORMAL LOW (ref 38.5–50.0)
Hemoglobin: 12.5 g/dL — ABNORMAL LOW (ref 13.2–17.1)
Lymphs Abs: 886 cells/uL (ref 850–3900)
MCH: 29.9 pg (ref 27.0–33.0)
MCHC: 33.1 g/dL (ref 32.0–36.0)
MCV: 90.4 fL (ref 80.0–100.0)
MPV: 10.7 fL (ref 7.5–12.5)
Monocytes Relative: 4.8 %
Neutro Abs: 10005 cells/uL — ABNORMAL HIGH (ref 1500–7800)
Neutrophils Relative %: 87 %
Platelets: 343 10*3/uL (ref 140–400)
RBC: 4.18 10*6/uL — ABNORMAL LOW (ref 4.20–5.80)
RDW: 11.2 % (ref 11.0–15.0)
Total Lymphocyte: 7.7 %
WBC: 11.5 10*3/uL — ABNORMAL HIGH (ref 3.8–10.8)

## 2019-08-10 LAB — QUANTIFERON-TB GOLD PLUS
Mitogen-NIL: 1.09 IU/mL
NIL: 0.04 IU/mL
QuantiFERON-TB Gold Plus: NEGATIVE
TB1-NIL: <0 IU/mL
TB2-NIL: <0 IU/mL

## 2019-08-10 LAB — HEPATITIS B SURFACE ANTIGEN: Hepatitis B Surface Ag: NONREACTIVE

## 2019-08-14 ENCOUNTER — Encounter: Payer: Self-pay | Admitting: Nurse Practitioner

## 2019-08-14 NOTE — Patient Instructions (Addendum)
Your health issues we discussed today were:   Persistent ulcerative colitis despite attempted treatment: 1. We will try to resubmit the labs with new diagnosis codes try to get them paid for. 2. Please have your labs completed as soon as possible 3. In the meantime continue your current medications 4. We will start submitting paperwork to obtain Humira 5. We can enroll you in the Humira "nurse ambassador program" which provides you with a disease and medication nursing hotline.  They can also send a nurse to your home to help teach you how to give injections 6. Let us know if you would like to enroll in his nurse ambassador program 7. Follow-up in 2 weeks 8. Call for any worsening or severe symptoms  Overall I recommend:  1. Continue your other current medications 2. Return for follow-up in 2-4 weeks 3. Call us for any questions or concerns.   At [facility] we value your feedback. You may receive a survey about your visit today. Please share your experience as we strive to create trusting relationships with our patients to provide genuine, compassionate, quality care.  We appreciate your understanding and patience as we review any laboratory studies, imaging, and other diagnostic tests that are ordered as we care for you. Our office policy is 5 business days for review of these results, and any emergent or urgent results are addressed in a timely manner for your best interest. If you do not hear from our office in 1 week, please contact us.   We also encourage the use of MyChart, which contains your medical information for your review as well. If you are not enrolled in this feature, an access code is on this after visit summary for your convenience. Thank you for allowing [place] to be involved in your care.  It was great to see you today!  I hope you have a great Summer!!

## 2019-08-14 NOTE — Assessment & Plan Note (Signed)
Follow-up based on colonoscopy with newly diagnosed ulcerative colitis.  Initial symptoms including diarrhea and rectal bleeding.  CT of the abdomen and pelvis on 311 of this year showed mild wall thickening and mucosal enhancement involving the sigmoid and rectum.  Colonoscopy completed 06/08/2019 which found inflamed rectum and left colon with focal inflammation of the cecum likely representing ulcerative colitis in evolution status post segmental biopsy with surgical pathology confirming the apparent diagnosis of IBD with mild to moderate active colitis in the cecum, descending colon, sigmoid colon, rectum.  He was started on Entocort.  This unfortunately ineffective.  Has since been placed on prednisone with taper.  This also has not helped.  Lialda did not help as well.  At this point proceeded with testing for QuantiFERON gold (tuberculosis) and hepatitis B surface antigen in anticipation of ramping up to biologic therapy.  It took some time to have labs completed due to insurance issues.  They are still pending when we saw him in the office for this visit.  Still with 10+ stools a day, urgency, loose stools with predominantly blood.  No nausea or vomiting.  Some subjective weight loss as he has had to eat just and add additional holes to his belt.  Also some weakness from when he is not able to eat normally.  He is trying valiantly to stay hydrated but it is difficult.  Some lightheadedness and fatigue.  Denies NSAIDs.  At this point I resubmitted labs for QuantiFERON and hepatitis B with alternative diagnosis.  The patient stated that his insurance would not pay he could cover the cost.  Recommend he continue his current medications.  We will start paperwork to consider Humira.  I will check a CBC due to persistent bleeding.  Follow-up in 2 weeks either in person or virtually.

## 2019-08-15 ENCOUNTER — Other Ambulatory Visit: Payer: Self-pay

## 2019-08-15 ENCOUNTER — Emergency Department (HOSPITAL_COMMUNITY): Payer: BC Managed Care – PPO

## 2019-08-15 ENCOUNTER — Encounter (HOSPITAL_COMMUNITY): Payer: Self-pay | Admitting: Emergency Medicine

## 2019-08-15 ENCOUNTER — Emergency Department (HOSPITAL_COMMUNITY)
Admission: EM | Admit: 2019-08-15 | Discharge: 2019-08-15 | Disposition: A | Discharge status: Home / Self Care | Payer: BC Managed Care – PPO | Attending: Emergency Medicine | Admitting: Emergency Medicine

## 2019-08-15 DIAGNOSIS — K51911 Ulcerative colitis, unspecified with rectal bleeding: Secondary | ICD-10-CM

## 2019-08-15 DIAGNOSIS — Z79899 Other long term (current) drug therapy: Secondary | ICD-10-CM | POA: Diagnosis not present

## 2019-08-15 DIAGNOSIS — Z87891 Personal history of nicotine dependence: Secondary | ICD-10-CM | POA: Insufficient documentation

## 2019-08-15 DIAGNOSIS — R109 Unspecified abdominal pain: Secondary | ICD-10-CM | POA: Diagnosis not present

## 2019-08-15 LAB — URINALYSIS, ROUTINE W REFLEX MICROSCOPIC
Bilirubin Urine: NEGATIVE
Glucose, UA: NEGATIVE mg/dL
Hgb urine dipstick: NEGATIVE
Ketones, ur: NEGATIVE mg/dL
Leukocytes,Ua: NEGATIVE
Nitrite: NEGATIVE
Protein, ur: NEGATIVE mg/dL
Specific Gravity, Urine: 1.018 (ref 1.005–1.030)
pH: 7 (ref 5.0–8.0)

## 2019-08-15 LAB — CBC WITH DIFFERENTIAL/PLATELET
Abs Immature Granulocytes: 0.03 10*3/uL (ref 0.00–0.07)
Basophils Absolute: 0 10*3/uL (ref 0.0–0.1)
Basophils Relative: 1 %
Eosinophils Absolute: 0 10*3/uL (ref 0.0–0.5)
Eosinophils Relative: 0 %
HCT: 35.7 % — ABNORMAL LOW (ref 39.0–52.0)
Hemoglobin: 11.6 g/dL — ABNORMAL LOW (ref 13.0–17.0)
Immature Granulocytes: 0 %
Lymphocytes Relative: 19 %
Lymphs Abs: 1.4 10*3/uL (ref 0.7–4.0)
MCH: 29.8 pg (ref 26.0–34.0)
MCHC: 32.5 g/dL (ref 30.0–36.0)
MCV: 91.8 fL (ref 80.0–100.0)
Monocytes Absolute: 0.8 10*3/uL (ref 0.1–1.0)
Monocytes Relative: 11 %
Neutro Abs: 5.1 10*3/uL (ref 1.7–7.7)
Neutrophils Relative %: 69 %
Platelets: 251 10*3/uL (ref 150–400)
RBC: 3.89 MIL/uL — ABNORMAL LOW (ref 4.22–5.81)
RDW: 11.9 % (ref 11.5–15.5)
WBC: 7.3 10*3/uL (ref 4.0–10.5)
nRBC: 0 % (ref 0.0–0.2)

## 2019-08-15 LAB — COMPREHENSIVE METABOLIC PANEL
ALT: 21 U/L (ref 0–44)
AST: 19 U/L (ref 15–41)
Albumin: 3.5 g/dL (ref 3.5–5.0)
Alkaline Phosphatase: 49 U/L (ref 38–126)
Anion gap: 9 (ref 5–15)
BUN: 13 mg/dL (ref 6–20)
CO2: 28 mmol/L (ref 22–32)
Calcium: 8.9 mg/dL (ref 8.9–10.3)
Chloride: 98 mmol/L (ref 98–111)
Creatinine, Ser: 0.96 mg/dL (ref 0.61–1.24)
GFR calc Af Amer: >60 mL/min (ref >60)
GFR calc non Af Amer: >60 mL/min (ref >60)
Glucose, Bld: 105 mg/dL — ABNORMAL HIGH (ref 70–99)
Potassium: 4 mmol/L (ref 3.5–5.1)
Sodium: 135 mmol/L (ref 135–145)
Total Bilirubin: 0.6 mg/dL (ref 0.3–1.2)
Total Protein: 7 g/dL (ref 6.5–8.1)

## 2019-08-15 MED ORDER — PREDNISONE 10 MG PO TABS: ORAL_TABLET | ORAL | 0 refills | Status: DC

## 2019-08-15 MED ORDER — IOHEXOL 300 MG/ML  SOLN
100.0000 mL | Freq: Once | INTRAMUSCULAR | Status: AC | PRN
  Administered 2019-08-15: 100 mL via INTRAVENOUS

## 2019-08-15 MED ORDER — SODIUM CHLORIDE 0.9 % IV BOLUS
1000.0000 mL | Freq: Once | INTRAVENOUS | Status: AC
  Administered 2019-08-15: 1000 mL via INTRAVENOUS

## 2019-08-15 MED ORDER — PREDNISONE 20 MG PO TABS
40.0000 mg | ORAL_TABLET | Freq: Once | ORAL | Status: AC
  Administered 2019-08-15: 40 mg via ORAL
  Filled 2019-08-15: qty 2

## 2019-08-15 NOTE — ED Notes (Signed)
abd pain for the last week   Dx with colitis by Dr Gala Romney, but his office has not returned his phone calls   Audible BS abd tender

## 2019-08-15 NOTE — ED Provider Notes (Signed)
[facility] EMERGENCY DEPARTMENT Provider Note   CSN: [id number] Arrival date & time: 08/15/19  1502     History Chief Complaint  Patient presents with  . Abdominal Pain    Michael Mcdowell is a 29 y.o. male with a history of recently diagnosed ulcerative colitis under the care of Dr. Gala Romney presenting with escalating abdominal pain, reporting 10-15 loose bloody stools in 24-hour timeframe along with intermittent fevers with T-max of 101 alternating with chills.  He denies nausea or vomiting but has had a poor p.o. intake, has been able to hydrate effectively, but reports generalized fatigue and escalating pain in his left abdomen.  Current medications include mesalamine tablets and he is on a prednisone taper, currently taking 20 mg daily for the past week, will taper to 10 mg tomorrow, this is his third week of a 4-week prednisone taper.    The history is provided by the patient and medical records.       Past Medical History:  Diagnosis Date  . Substance abuse in remission [facility])     Patient Active Problem List   Diagnosis Date Noted  . Ulcerative colitis ([place]) 07/10/2019  . Rectal bleeding 05/20/2019  . Colitis 05/20/2019  . KNEE PAIN, RIGHT 10/15/2007  . TALIPES CAVUS 10/15/2007    Past Surgical History:  Procedure Laterality Date  . BIOPSY  06/08/2019   Procedure: BIOPSY;  Surgeon: Daneil Dolin, MD;  Location: [facility];  Service: Endoscopy;;  . COLONOSCOPY WITH PROPOFOL N/A 06/08/2019   Inflamed rectum and left colon likely representing UC in evolution. Cecal: midly active colitis, de=scending colon with moderately active colitis, sigmoid with moderately active colitis, rectum with moderately active colitis  . Tyhroglossal duct cyst         Family History  Problem Relation Age of Onset  . Cancer Paternal Grandmother   . Colon cancer Neg Hx   . Colon polyps Neg Hx   . Inflammatory bowel disease Neg Hx     Social History   Tobacco Use  . Smoking status:  Former Smoker    Types: Cigarettes  . Smokeless tobacco: Former Systems developer    Types: Chew  . Tobacco comment: no cigarettes in 4 years,   Vaping Use  . Vaping Use: Never used  Substance Use Topics  . Alcohol use: Yes    Comment: rare  . Drug use: Not Currently    Comment: pain pills 5 years ago, none since then.     Home Medications Prior to Admission medications   Medication Sig Start Date End Date Taking? Authorizing Provider  acetaminophen (TYLENOL) 500 MG tablet Take 1,000 mg by mouth as needed for moderate pain.     [provider]  AMOXICILLIN PO Take by mouth. For tick bite. Takes when he remembers    [provider]  mesalamine (LIALDA) 1.2 g EC tablet Take 4 tablets (4.8 g total) by mouth daily with breakfast. 07/10/19   Annitta Needs, NP  predniSONE (DELTASONE) 10 MG tablet Take 4 tablets daily. 08/15/19   Evalee Jefferson, PA-C    Allergies    Doxycycline  Review of Systems   Review of Systems  Constitutional: Positive for chills and fever.  HENT: Negative for congestion and sore throat.   Eyes: Negative.   Respiratory: Negative for chest tightness and shortness of breath.   Cardiovascular: Negative for chest pain.  Gastrointestinal: Positive for abdominal pain, blood in stool and diarrhea. Negative for nausea and vomiting.  Genitourinary:  Negative.   Musculoskeletal: Negative for arthralgias, joint swelling and neck pain.  Skin: Negative.  Negative for rash and wound.  Neurological: Negative for dizziness, weakness, light-headedness, numbness and headaches.  Psychiatric/Behavioral: Negative.     Physical Exam Updated Vital Signs BP 117/64 (BP Location: Right Arm)   Pulse 82   Temp 98.7 F (37.1 C) (Oral)   Resp 18   Ht 5' 7"  (1.702 m)   Wt 61.2 kg   SpO2 99%   BMI 21.13 kg/m   Physical Exam Vitals and nursing note reviewed.  Constitutional:      Appearance: He is well-developed.  HENT:     Head: Normocephalic and atraumatic.  Eyes:      Conjunctiva/sclera: Conjunctivae normal.  Cardiovascular:     Rate and Rhythm: Regular rhythm. Tachycardia present.     Heart sounds: Normal heart sounds.  Pulmonary:     Effort: Pulmonary effort is normal.     Breath sounds: Normal breath sounds. No wheezing.  Abdominal:     General: Abdomen is flat. Bowel sounds are normal.     Palpations: Abdomen is soft.     Tenderness: There is abdominal tenderness in the left upper quadrant and left lower quadrant. There is no guarding or rebound.  Musculoskeletal:        General: Normal range of motion.     Cervical back: Normal range of motion.  Skin:    General: Skin is warm and dry.  Neurological:     Mental Status: He is alert.     ED Results / Procedures / Treatments   Labs (all labs ordered are listed, but only abnormal results are displayed) Labs Reviewed  CBC WITH DIFFERENTIAL/PLATELET - Abnormal; Notable for the following components:      Result Value   RBC 3.89 (*)    Hemoglobin 11.6 (*)    HCT 35.7 (*)    All other components within normal limits  COMPREHENSIVE METABOLIC PANEL - Abnormal; Notable for the following components:   Glucose, Bld 105 (*)    All other components within normal limits  URINALYSIS, ROUTINE W REFLEX MICROSCOPIC    EKG None  Radiology CT ABDOMEN PELVIS W CONTRAST  Result Date: 08/15/2019 CLINICAL DATA:  Abdominal pain for 1 week. Fever. Recent diagnosis of colitis with possible UC on colonoscopy. EXAM: CT ABDOMEN AND PELVIS WITH CONTRAST TECHNIQUE: Multidetector CT imaging of the abdomen and pelvis was performed using the standard protocol following bolus administration of intravenous contrast. CONTRAST:  162m OMNIPAQUE IOHEXOL 300 MG/ML  SOLN COMPARISON:  Most recent CT 05/14/2019 FINDINGS: Lower chest: The lung bases are clear. Hepatobiliary: No focal liver abnormality is seen. No gallstones, gallbladder wall thickening, or biliary dilatation. Pancreas: No ductal dilatation or inflammation. Spleen:  Mildly enlarged spanning 13.6 cm cranial caudal. No focal abnormality. Adrenals/Urinary Tract: Normal adrenal glands. No hydronephrosis or perinephric edema. Homogeneous renal enhancement. Urinary bladder is partially distended without wall thickening. Stomach/Bowel: Wall thickening with pericolonic edema extending from the hepatic flexure of the colon through the rectum. There is hyperemia and engorgement of the mesenteric vessels most prominent in the descending and sigmoid colon. No evidence of pneumatosis, perforation or abscess. Moderate stool burden in the ascending colon. The appendix is not definitively visualized. There is no evidence of small bowel inflammation. No bowel obstruction. Stomach is decompressed. Vascular/Lymphatic: Abdominal aorta is normal in caliber. The portal vein is patent. Mesenteric vessels are patent. Few prominent perirectal and mesenteric nodes. No other adenopathy. Reproductive: Prostate is unremarkable.  Other: No free air, free fluid, or intra-abdominal fluid collection. Musculoskeletal: There are no acute or suspicious osseous abnormalities. Few scattered Schmorl's nodes in the lower thoracic spine. IMPRESSION: 1. Wall thickening with pericolonic edema extending from the hepatic flexure of the colon through the rectum, consistent with colitis, likely infectious or inflammatory. No evidence of pneumatosis, perforation or abscess. 2. Mild splenomegaly. Electronically Signed   By: Keith Rake M.D.   On: 08/15/2019 18:22    Procedures Procedures (including critical care time)  Medications Ordered in ED Medications  predniSONE (DELTASONE) tablet 40 mg (has no administration in time range)  sodium chloride 0.9 % bolus 1,000 mL (0 mLs Intravenous Stopped 08/15/19 1715)  iohexol (OMNIPAQUE) 300 MG/ML solution 100 mL (100 mLs Intravenous Contrast Given 08/15/19 1755)    ED Course  I have reviewed the triage vital signs and the nursing notes.  Pertinent labs & imaging  results that were available during my care of the patient were reviewed by me and considered in my medical decision making (see chart for details).    MDM Rules/Calculators/A&P                          Labs and imaging reviewed and discussed with patient.  Also discussed with Dr. Gala Romney who recommends returning to 40 mg prednisone daily until seen by them in follow up (will probably need to stay on this dose as bridging until he starts the biologic, currently working on getting approved for Humira).   Return precautions discussed.  Pt appears stable for dc home.  Final Clinical Impression(s) / ED Diagnoses Final diagnoses:  Ulcerative colitis with rectal bleeding, unspecified location [facility])    Rx / DC Orders ED Discharge Orders         Ordered    predniSONE (DELTASONE) 10 MG tablet     Discontinue  Reprint     08/15/19 1902           Landis Martins 08/15/19 1912    Maudie Flakes, MD 08/15/19 2316

## 2019-08-15 NOTE — ED Notes (Signed)
To CT

## 2019-08-15 NOTE — ED Triage Notes (Signed)
Pt reports he has been diagnosed with ulcerative colitis and seen Dr. Gala Romney, he reports increased abd pain, weakness, fevers and chills-reports no calls back from dr's office

## 2019-08-15 NOTE — Discharge Instructions (Addendum)
Your labs are stable.  Your CT imaging shows continued intestinal inflammation as the source of your symptoms. I have spoken with Dr Gala Romney who recommends increasing your prednisone to 40 mg daily until your new treatment is arranged (Humira).   Do not take any anti inflammatories in addition to the prednisone (no advil, motrin, naproxen, ibuprofen).

## 2019-08-17 ENCOUNTER — Telehealth: Payer: Self-pay

## 2019-08-17 NOTE — Telephone Encounter (Signed)
RX was sent to [facility] Thursday 6/10/2 to start Humira ok per EG. Received a call from ED on Friday 08/14/19, [place] [facility] won't be able to get Humira for pt due to pts insurance company having their own carries for Humira. Called [employer] and wasn't able to get a representative to find out which pharmacy pt can receive Humira from. Will call this morning 08/17/19.

## 2019-08-17 NOTE — Telephone Encounter (Signed)
-----   Message from Daneil Dolin, MD sent at 08/16/2019  8:04 AM EDT ----- ER called me about this patient yesterday.  Worsening diarrhea blood per rectum.  Colitis more extensive than seen previously on CT.  Recommend prednisone be increase to 40 mg daily until he hears otherwise from our office.  Need to prioritize getting set up for Humira (hepatitis B, TB and insurance preauthorization).  Probably needs to start ASAP.  Delegate to an app to facilitate this week ASAP.  Thanks.

## 2019-08-17 NOTE — Telephone Encounter (Signed)
Routing to AB in EG absence.

## 2019-08-17 NOTE — Telephone Encounter (Signed)
Called pts [employer]. Pt's medication needs authorization and can only be filled through [employer]. The representative is faxing over form to be completed for authorization.

## 2019-08-18 NOTE — Progress Notes (Signed)
Referring Provider: Caryl Bis, MD Primary Care Physician:  Caryl Bis, MD Primary GI Physician: Dr. Gala Romney  Chief Complaint  Patient presents with  . Abdominal Pain    abd pain worse, diarrhea more frequent, feels dehydrated at times, nausea daily, wt loss 23 lbs since TCS    HPI:   Michael Mcdowell is a 29 y.o. male with newly diagnosed ulcerative colitis in April 2021 via colonoscopy which was pursued due to diarrhea and rectal bleeding.  Colonoscopy found inflamed rectum and left colon with focal inflammation in the cecum likely representing UC in evolution s/p biopsy.  Surgical pathology confirmed apparent diagnosis of IBD with mild to moderate active colitis in the cecum, descending colon, sigmoid colon, rectum.  He was started on Entocort at that time.  Seen 07/10/2019 with mild improvement initially with Entocort but with return of symptoms.  Entocort was refilled to extend a longer course, Rowasa enemas added, and Lialda 4.8 g daily was started.  He was unable to tolerate enemas and unfortunately, did not have any significant improvement with Lialda.  He was started on prednisone on 5/21 with plans to obtain labs to prepare for likely starting a biologic agent in the future.  Suspect that he would be a good candidate for Humira.  Labs with CMV and EBV antibodies present representing history of past infection.  Would need to keep this in mind of starting Humira due to risk of reactivation.  Hepatitis C antibody negative, negative Hep B surface antibody and core antibody total negative.  Sed rate 31 (H), CRP 51.6 (H).   Last seen in our office 08/04/2019.  Varicella and QuantiFERON were not covered by insurance.  Also needed hep B surface antigen.  He had ongoing abdominal pain with every BM with at least 10 BMs a day, also with nocturnal BMs.  Stools were loose/watery and predominantly bloody.  He reported Entocort, prednisone, nor Lialda helped.  Did not have chickenpox as a child  but had the vaccine.  Subjective weight loss.  Weakness secondary not to eating much.  Started to stay hydrated.  If dehydrated, would feel lightheaded.  No NSAIDs.  Plans to complete additional labs needed for biologic therapy including QuantiFERON gold (tuberculosis) and hepatitis B surface antigen.  Would also start paperwork to consider Humira.  CBC ordered due to persistent bleeding.  Labs with hep B surface antigen nonreactive.  QuantiFERON gold negative.  He was seen in the ED 08/15/2019 due to worsening abdominal pain, 10-15 loose bloody stools in 24 hours, intermittent fevers with T-max of 101 (98.7 in the ED). No nausea/vomiting.  He was taking 20 mg of prednisone at that time.  CT A/P with contrast with worsening colitis, wall thickening with pericolonic edema extending from the hepatic flexure of the colon through the rectum.  CBC with hemoglobin 11.6 (L), WBC normal.  CMP essentially normal.  Case was discussed with Dr. Gala Romney who recommended return to 40 mg of prednisone daily and follow-up in the office.   Telephone note 08/17/2019 stating prescription for Humira was sent [facility] on 6/10 with okay to start by Walden Field, NP.  There were some issues getting the medication covered as his insurance would only allow him to receive the medication through a certain pharmacy, [facility].  States representative is faxing over form to be completed for authorization.  Today:  Taking Prednisone 70m daily. Having 15-20 BMs daily. Most of the time it is just blood. Some watery  diarrhea. Abdominal pain primarily in center of abdomen and in lower abdomen. About 6/10, will get up to 8/10. Slight nausea, nothing significant. No vomiting. This is new however. Reports 23 lbs in the last 2 months. Per our records, he has lost 12 lbs in the last 3 months.   Had PCP fax vaccine records already.   Occasional lightheadedness. Weakness all the time. Eating about 1 meal a day. Limiting meals due to BMs. BMs are  worse after eating and at night. Drinking plenty of fluids. Water and Gatorade. Fever and chills at night. No fever since being seen in the ED. Waking up about evey hour to have a BM.   He was on amoxicillin about 1 month ago for a tick bite x 5 days. Drinks well water but it is supposed to be filtered.   No NSAIDs.  No smoking.   Past Medical History:  Diagnosis Date  . Substance abuse in remission ([place])   . Ulcerative colitis ([place]) 06/2019    Past Surgical History:  Procedure Laterality Date  . BIOPSY  06/08/2019   Procedure: BIOPSY;  Surgeon: Daneil Dolin, MD;  [facility];  Service: Endoscopy;;  . COLONOSCOPY WITH PROPOFOL N/A 06/08/2019   Inflamed rectum and left colon likely representing UC in evolution. Cecal: midly active colitis, de=scending colon with moderately active colitis, sigmoid with moderately active colitis, rectum with moderately active colitis  . Tyhroglossal duct cyst      Current Outpatient Medications  Medication Sig Dispense Refill  . acetaminophen (TYLENOL) 500 MG tablet Take 1,000 mg by mouth as needed for moderate pain.     . predniSONE (DELTASONE) 10 MG tablet Take 4 tablets daily. 80 tablet 0   No current facility-administered medications for this visit.    Allergies as of 08/19/2019 - Review Complete 08/19/2019  Allergen Reaction Noted  . Doxycycline Other (See Comments) 08/31/2014    Family History  Problem Relation Age of Onset  . Cancer Paternal Grandmother   . Colon cancer Neg Hx   . Colon polyps Neg Hx   . Inflammatory bowel disease Neg Hx     Social History   Socioeconomic History  . Marital status: Single    Spouse name: Not on file  . Number of children: Not on file  . Years of education: Not on file  . Highest education level: Not on file  Occupational History  . Not on file  Tobacco Use  . Smoking status: Former Smoker    Types: Cigarettes  . Smokeless tobacco: Former Systems developer    Types: Chew  . Tobacco comment:  no cigarettes in 4 years,   Vaping Use  . Vaping Use: Never used  Substance and Sexual Activity  . Alcohol use: Not Currently    Comment: rare  . Drug use: Not Currently    Comment: pain pills 5 years ago, none since then.   . Sexual activity: Not on file  Other Topics Concern  . Not on file  Social History Narrative  . Not on file   Social Determinants of Health   Financial Resource Strain:   . Difficulty of Paying Living Expenses:   Food Insecurity:   . Worried About Charity fundraiser in the Last Year:   . Arboriculturist in the Last Year:   Transportation Needs:   . Film/video editor (Medical):   Marland Kitchen Lack of Transportation (Non-Medical):   Physical Activity:   . Days of Exercise per  Week:   . Minutes of Exercise per Session:   Stress:   . Feeling of Stress :   Social Connections:   . Frequency of Communication with Friends and Family:   . Frequency of Social Gatherings with Friends and Family:   . Attends Religious Services:   . Active Member of Clubs or Organizations:   . Attends Archivist Meetings:   Marland Kitchen Marital Status:     Review of Systems: Gen: See HPI CV: Denies chest pain or palpitations Resp: Denies dyspnea at rest. Intermittent cough.  GI: See HPI Derm: Denies rash Heme: See HPI  Physical Exam: BP (!) 142/76   Pulse (!) 103   Temp 97.7 F (36.5 C) (Temporal)   Ht 5' 7"  (1.702 m)   Wt 133 lb 9.6 oz (60.6 kg)   BMI 20.92 kg/m  General:   Alert and oriented. No distress noted. Pleasant and cooperative.  Head:  Normocephalic and atraumatic. Eyes:  Conjuctiva clear without scleral icterus. Heart:  S1, S2 present without murmurs appreciated. Lungs:  Clear to auscultation bilaterally. No wheezes, rales, or rhonchi. No distress.  Abdomen:  +BS, soft, and non-distended.  Mild tenderness palpation in the mid abdomen, left lower quadrant, and left upper quadrant.  Greatest in the left upper quadrant.  No rebound or guarding. No HSM or masses  noted. Msk:  Symmetrical without gross deformities. Normal posture. Extremities:  Without edema. Neurologic:  Alert and  oriented x4 Psych:  Normal mood and affect.

## 2019-08-18 NOTE — Telephone Encounter (Signed)
Called [employer] to get new forms since EG isn't in the office. New forms have been completed and are on [facility] desk for review.

## 2019-08-19 ENCOUNTER — Ambulatory Visit (INDEPENDENT_AMBULATORY_CARE_PROVIDER_SITE_OTHER): Payer: BC Managed Care – PPO | Admitting: Gastroenterology

## 2019-08-19 ENCOUNTER — Other Ambulatory Visit: Payer: Self-pay

## 2019-08-19 ENCOUNTER — Encounter: Payer: Self-pay | Admitting: Gastroenterology

## 2019-08-19 VITALS — BP 142/76 | HR 103 | Temp 97.7°F | Ht 67.0 in | Wt 133.6 lb

## 2019-08-19 DIAGNOSIS — R197 Diarrhea, unspecified: Secondary | ICD-10-CM | POA: Diagnosis not present

## 2019-08-19 DIAGNOSIS — K51911 Ulcerative colitis, unspecified with rectal bleeding: Secondary | ICD-10-CM

## 2019-08-19 NOTE — Assessment & Plan Note (Addendum)
29 year old male recently diagnosed with ulcerative colitis in April 2021 via colonoscopy which was pursued due to diarrhea and rectal bleeding.  Colonoscopy found inflamed rectum and left colon with focal inflammation of the cecum s/p biopsy.  Pathology revealed mild to moderate active colitis in the cecum, descending colon, sigmoid colon, and rectum.  Unfortunately, his symptoms have not responded well to medical therapy.  He was treated with Entocort initially.  This was escalated to addition of Rowasa enemas and Lialda.  Patient continued with symptoms and was started on prednisone 40 mg on 5/21 with plans for a 4-week taper.  Unfortunately, this did not provide any improvement and he ended up back in the emergency room on 6/12 with worsening abdominal pain and hematochezia.  He was taking 20 mg of prednisone at that time.  CT A/P revealed worsening colitis with wall thickening/pericolonic edema extending from the hepatic flexure of the colon through the rectum.  Hemoglobin had declined to 11.6.  He is advised to return to prednisone 40 mg daily.  Today, he reports no improvement in symptoms over the last 3 months.  Symptoms have actually been worsening.  Currently with 15-20 BMs daily, mostly bloody but some watery diarrhea.  BMs are worse after meals and at nighttime.  Worsening abdominal pain.  Slight nausea without vomiting.  12 pound weight loss in the last 3 months.  Subjective intermittent fevers at home.  Occasional lightheadedness.  Feels weak.  Limiting meals due to worsening diarrhea.  He is drinking plenty of Gatorade and water to stay hydrated.  Admits to taking amoxicillin about 1 month ago and drinking well water for the symptoms to be filtered.  Denies NSAIDs or smoking tobacco.  He has completed labs that are needed prior to initiating biologic therapy. Hepatitis B, hepatitis C, QuantiFERON gold all negative.CMV and EBV antibodies present representing past infection.  Patient reports being  vaccinated against varicella. Our office has already been working on getting him started on Humira. Michael Field, NP had filled out paperwork historically, but insurance only allowed Humira to be obtained through [employer].  I have new paperwork to fill out today.  I do suspect patient symptoms are secondary to ulcerative colitis.  However, as he has not had any improvement at all with typical therapy, I do feel we need to go ahead and rule out any sort of infectious diarrhea while we are waiting on getting Humira approved.  I am very concerned that this patient is on the edge of needing admission due to such severe symptoms.  Complete C. difficile and GI pathogen panel. I will complete paperwork for Humira today.  Hopefully we will get this started by the end of the week.   Continue prednisone 40 mg daily for now. Advised he has any worsening symptoms at all including worsening abdominal pain, worsening hematochezia, lightheadedness, dizziness, feeling like he would pass out, weakness, inability to stay hydrated, he must proceed to the emergency room.  Request vaccine records from PCP.  Follow-up in 2 weeks.

## 2019-08-19 NOTE — Telephone Encounter (Signed)
[employer] paper work was faxed today 08/19/19 @ 1:08 PM and marked urgent. Waiting on a response from [facility].

## 2019-08-19 NOTE — Patient Instructions (Addendum)
Please have stool studies completed at [facility].   Continue taking Prednisone 40 mg daily for now.   I am filling out paper work for [employer] today. Hopefully we can get you started on this by the end of the week.   If your symptoms worsen at all (including worsening abdominal pain, rectal bleeding, feeling lightheaded, dizzy, like you may pass out, worsening weakness, or unable to keep yourself hydrated) you will need to go back to the emergency room as you may need to be admitted.   We will plan to see you back in 2 weeks.   Aliene Altes, PA-C [facility]

## 2019-08-20 DIAGNOSIS — R197 Diarrhea, unspecified: Secondary | ICD-10-CM | POA: Diagnosis not present

## 2019-08-21 LAB — GASTROINTESTINAL PATHOGEN PANEL PCR
C. difficile Tox A/B, PCR: NOT DETECTED
Campylobacter, PCR: NOT DETECTED
Cryptosporidium, PCR: NOT DETECTED
E coli (ETEC) LT/ST PCR: NOT DETECTED
E coli (STEC) stx1/stx2, PCR: NOT DETECTED
E coli 0157, PCR: NOT DETECTED
Giardia lamblia, PCR: NOT DETECTED
Norovirus, PCR: NOT DETECTED
Rotavirus A, PCR: NOT DETECTED
Salmonella, PCR: NOT DETECTED
Shigella, PCR: NOT DETECTED

## 2019-08-21 LAB — C. DIFFICILE GDH AND TOXIN A/B
GDH ANTIGEN: NOT DETECTED
MICRO NUMBER:: 10606878
SPECIMEN QUALITY:: ADEQUATE
TOXIN A AND B: NOT DETECTED

## 2019-08-23 NOTE — Progress Notes (Signed)
C. Diff and GI pathogen panel negative.   Do we have any updates on Humira yet

## 2019-08-24 ENCOUNTER — Telehealth: Payer: Self-pay | Admitting: Internal Medicine

## 2019-08-24 NOTE — Telephone Encounter (Signed)
Pt is aware that we have sent a request for Humira last week. Waiting on a response. Pt says he was given prednisone at the hospital and his pharmacy doesn't have the medicine. [facility] Dr. Loni Muse verbal order was given. Evalee Jefferson PA was the providing PA that sent RX on 08/15/19. [facility] never received RX and will get Prednisone 10 mg #80 ready for pt.

## 2019-08-24 NOTE — Telephone Encounter (Signed)
Pt called to follow up on his prescription. I told him that I just took the refill request off the fax this morning and the provider is aware. [phone]

## 2019-08-26 ENCOUNTER — Telehealth: Payer: Self-pay | Admitting: Internal Medicine

## 2019-08-26 ENCOUNTER — Telehealth: Payer: Self-pay

## 2019-08-26 ENCOUNTER — Other Ambulatory Visit: Payer: Self-pay | Admitting: Gastroenterology

## 2019-08-26 DIAGNOSIS — K51911 Ulcerative colitis, unspecified with rectal bleeding: Secondary | ICD-10-CM

## 2019-08-26 MED ORDER — HUMIRA-CD/UC/HS STARTER 80 MG/0.8ML ~~LOC~~ AJKT: AUTO-INJECTOR | SUBCUTANEOUS | 0 refills | Status: DC

## 2019-08-26 MED ORDER — HUMIRA 40 MG/0.8ML ~~LOC~~ PSKT: PREFILLED_SYRINGE | SUBCUTANEOUS | 2 refills | Status: DC

## 2019-08-26 NOTE — Telephone Encounter (Signed)
Michael Mcdowell: Rx sent to pharmacy. Please help connect patient with nurse ambassador to help guide administration.   Erline Levine, do we have patient on the cancellation list for a follow-up in 2 weeks?

## 2019-08-26 NOTE — Telephone Encounter (Signed)
Spoke with patient. No significnat change since I saw him. Abdominal pain at night as always been the worst. Continues with 15 bloody Bms daily. During the 6 hours he is without pain, BM frequency improves. Overall, he notes he feels somewhat improved compared to the first time he was started on Prednisone 40 mg. States when he was initially started on prednisone, it was like he wasn't taking aything, now he is having significant relief for 6 hours at a time. Doesn't feel he needs to go to the emergency room as symptoms are not as severe as they were when he was seen in the ED 2 weeks ago. Wants to know if he can increase prednisone. Discussed that oral prednisone 40 mg is the typical max dosing for induction therapy. I am concerned that he is having minimal improvement over the last 2 weeks and continues with such severe symptoms. He was actually started on steroids on 5/21. We discussed ED return precautions and I let him know I would discuss further with Dr. Gala Romney regarding prednisone adjustments vs needing inpatient management.   Elmo Putt, any idea as to when patient will actually get Humira?

## 2019-08-26 NOTE — Telephone Encounter (Signed)
Tried to call patient back. Unable to reach him. Will try again before 6pm.

## 2019-08-26 NOTE — Telephone Encounter (Signed)
Called [facility] to discuss an Emergency planning/management officer for pt. I was placed on hold for 20 mins. Will call them back tomorrow.

## 2019-08-26 NOTE — Telephone Encounter (Signed)
Received an approval letter for Humira through 08-17-2020. Pt is aware of approval. [facility] was added to pts medication list. Can you please send RX into [facility]? Approval letter will be scanned in pts chart.

## 2019-08-26 NOTE — Telephone Encounter (Signed)
Spoke with pt. Pt is taking Prednisone that was given at the ED. Pt says he gets about a six hour period where he has no pain. Pt wants to know if he could take more of the Prednisone at night to get some sleep. Pt isn't able to sleep well due to the pain he feels at night.

## 2019-08-26 NOTE — Telephone Encounter (Signed)
Pt has questions about his medications. [phone]

## 2019-08-27 NOTE — Telephone Encounter (Signed)
FYI, Pt was enrolled with the [employer] program today. They will help pt with education/training for Humira. I called [facility] and faxed a copy of the PA approval letter to help speed the medication process up (fax [phone]). I left pt a detailed message. Pt is aware that he'll receive a call from a Humira Ambassador within 1 business day.

## 2019-08-27 NOTE — Telephone Encounter (Signed)
Noted  

## 2019-08-27 NOTE — Telephone Encounter (Signed)
Spoke with Dr. Gala Romney. He has never prescribed more than 29m prednisone daily for UC. Recommended to review guidelines.   Reviewed ACG Guidelines. See snip below.     I have recommended increasing to prednisone 50 mg daily x1 week then decrease back to 469m Patient has not heard anything regarding Humira. Advised he let usKoreanow tomorrow if he has not heard anything. It is very important we get this started ASAP. Also requested a progress report middle of next week to see how Prednisone 50 mg is working.

## 2019-08-27 NOTE — Telephone Encounter (Signed)
I haven't received an actual date that pt will receive medication. Since the medication has been approved, they will work on getting the copay info and verifying with pt a date to send medication out. It should be too long before the medication is received once payment is verified. Our office should also receive a letter stating the cost of pts med and that a delivery is scheduled.

## 2019-08-28 NOTE — Telephone Encounter (Signed)
HE IS ON THE CANCELLATION LIST

## 2019-08-31 ENCOUNTER — Telehealth: Payer: Self-pay | Admitting: Internal Medicine

## 2019-08-31 NOTE — Telephone Encounter (Signed)
Pt called to let us know that he has not heard from [facility] regarding his medication.

## 2019-09-01 NOTE — Telephone Encounter (Signed)
Lmom. [place] FYI , Spoke with Humira Rep and pts medication has been scheduled for delivery on 09/02/2019.

## 2019-09-02 ENCOUNTER — Other Ambulatory Visit: Payer: Self-pay

## 2019-09-02 DIAGNOSIS — K625 Hemorrhage of anus and rectum: Secondary | ICD-10-CM

## 2019-09-02 NOTE — Telephone Encounter (Signed)
Noted. Hopefully his symptoms will begin to improve now that we have started Humira. He should be decreasing prednisone to 40 mg today x 1 week. We will need to taper prednisone slowly as he has been on this for quite some time.   Would like for him to call us with an update in 1 week. Will offer further instructions regarding prednisone taper at that time.   Would like to go ahead and update CBC at this time to follow hemoglobin.

## 2019-09-02 NOTE — Telephone Encounter (Signed)
Spoke with pt. Pt will call back in one week with a progress report. Pt is aware that he will have to taper off Prednisone. CBC lab order was placed and pt will have labs completed.

## 2019-09-02 NOTE — Telephone Encounter (Signed)
Unfortunately, I am can not be sure that he didn't receive any of the first injection, I can not recommend that he give himself another. Lets proceed as planned with the next injection of 80 mg on day 15.   How is he feeling since increasing the prednisone to 50 mg daily? Any improvement in #bloody BMs or abdominal pain?

## 2019-09-02 NOTE — Telephone Encounter (Signed)
Pt called. He received his Humira pens. 3 Humira pens were delivered to pt. Pt has been in contact with the [employer]. When pt went to give himself the first injection which was suppose to be 160 mg, pt said the medication came out and he didn't receive the first injection. Pt went a head and gave himself the second dose of 80 mg. That pen worked well with no problems. Pt isn't sure what he needs to do since he didn't get the medication in that's needed when first started.

## 2019-09-02 NOTE — Telephone Encounter (Signed)
Spoke with pt. Pt is aware that his next Humira injection will be taken on day 15 as directed. Since adding the Prednisone 50 mg daily, pt still gets the 6-8 hours without pain and his pain comes back. Pt's pain is worse at night. Pt see less blood in his stool but the blood is still present.

## 2019-09-08 NOTE — Progress Notes (Signed)
Referring Provider: Caryl Bis, MD Primary Care Physician:  Caryl Bis, MD Primary GI Physician: Dr. Gala Romney  Chief Complaint  Patient presents with   Ulcerative Colitis    Humira    HPI:   Michael Mcdowell is a 29 y.o. male with newly diagnosed ulcerative colitis in April 2021 via colonoscopy which was pursued due to diarrhea and rectal bleeding.  Colonoscopy found inflamed rectum and left colon with focal inflammation in the cecum likely representing UC in evolution s/p biopsy.  Surgical pathology confirmed apparent diagnosis of IBD with mild to moderate active colitis in the cecum, descending colon, sigmoid colon, rectum.  Patient has had a very difficult time achieving improvement in symptoms. He tried and failed Entocort, Rowasa enemas, and Lialda.  He was started on prednisone on 5/21 with attempt to taper but no improvement in symptoms and patient ultimately ended up in the ED 08/15/2019 due to worsening abdominal pain and frequent bloody stools with intermittent fever.  Hemoglobin had drifted down to 11.6, CT A/P with contrast with worsening colitis, wall thickening with pericolonic edema extending from the hepatic flexure of the colon through the rectum.  He was on prednisone 20 mg at that time.  He has increased back to 40 mg daily.  He was last seen in our office 08/19/2019 for follow-up.  He was taking prednisone 40 mg and continued with 15-20 bloody BMs daily.  Mostly passing blood only but some watery diarrhea.  Continues with abdominal pain in the mid to lower abdomen.  Slight nausea without vomiting.  Documented 12 pound weight loss in the last 3 months.  Occasional lightheadedness.  Weakness all the time.  Eating about 1 meal a day.  Reported being on amoxicillin about 1 month ago for tick bite for 5 days.  Also reported drinking well water that is "supposed to be filtered."  No NSAIDs.  No tobacco use.  Overall, very concerned that patient was on the edge of needing  admission due to severe symptoms.  Plan to check stool for C. difficile and GI pathogen panel, complete paperwork for Humira, continue prednisone 40 mg daily for now.  ED return precautions given.  C. difficile and GI pathogen panel negative.  Telephone call 08/26/2019 with patient requesting increase in prednisone to help him sleep at night.  He was getting about 6 hours of pain relief during the day with prednisone.  Overall reported feeling somewhat improved compared to the prior time he was on prednisone but overall improvement seem to minimal as he continued with 15 bloody BMs daily and abdominal pain.  Reviewed ACG guidelines which states the typical starting dose of prednisone is 40-60 mg/day.  Patient's prednisone was increased to 50 mg/day on 6/24.  Patient received Humira 6/29.  He was to give himself two 80 mg injections.  When he went to give himself the first injection, he is not sure the medication actually went into his thigh.  He proceeded with a second injection which was successful.  As I could not ensure how much he did or did not get of the first injection, he was advised to proceed as planned with next injection of 80 mg on day 15.  He reported since being on prednisone 50 mg daily, he continued with abdominal pain but was noting some improvement in rectal bleeding.  He was advised to decrease prednisone back to 40 mg daily and go ahead and repeat CBC.  CBC has not been completed.  Today: 4-5 BMs during the day. Still with BM every hour or so at night. Bleeding is improving. Not quite as often. Starting to have some solid pieces in the stool. Abdominal pain is worse at night. About 6/10 at night. Doesn't keep him from sleeping. Some nights are better than other. This is new as he was having persistent pain. Changed eating habits. Low fiber diet. Low fat diet. No nausea. Feels bloated at night.   Went back to work yesterday. This was his first day back.   Taking Prednisone 40 mg  now.  Starts 30 mg tomorrow.   Gets weak and fatigued very quickly. No pre-syncope or syncope.   Total weight loss of 29 pounds since March 2021.  Past Medical History:  Diagnosis Date   Substance abuse in remission [facility])    Ulcerative colitis ([place]) 06/2019    Past Surgical History:  Procedure Laterality Date   BIOPSY  06/08/2019   Procedure: BIOPSY;  Surgeon: Daneil Dolin, MD;  Location: [facility];  Service: Endoscopy;;   COLONOSCOPY WITH PROPOFOL N/A 06/08/2019   Inflamed rectum and left colon likely representing UC in evolution. Cecal: midly active colitis, de=scending colon with moderately active colitis, sigmoid with moderately active colitis, rectum with moderately active colitis   Tyhroglossal duct cyst      Current Outpatient Medications  Medication Sig Dispense Refill   acetaminophen (TYLENOL) 500 MG tablet Take 1,000 mg by mouth as needed for moderate pain.      Adalimumab (HUMIRA PEN-CD/UC/HS STARTER) 80 MG/0.8ML PNKT Inject 160 mg into the skin on day 1, then inject 80 mg into the skin on day 15, then stop. On day 29, start 40 mg into the skin every 2 weeks (this is the maintenance dose-see separate prescription for 40 mg pen) 3 each 0   Adalimumab (HUMIRA) 40 MG/0.8ML PSKT Inject 40 mg into the skin every 14 days starting on day 29. See starter pack for dosing on day 1 and day 15. 2 each 2   predniSONE (DELTASONE) 10 MG tablet Take 4 tablets (40 mg) by mouth daily x7 days, then take 3 tablets (30 mg) by mouth x7 days, then take 2 tablets (20 mg) by mouth x7 days, then take 1 tablet (10 mg) by mouth x7 days, then take 1/2 tablet (5 mg) by mouth x7 days. 74 tablet 0   No current facility-administered medications for this visit.    Allergies as of 09/09/2019 - Review Complete 09/09/2019  Allergen Reaction Noted   Doxycycline Other (See Comments) 08/31/2014    Family History  Problem Relation Age of Onset   Cancer Paternal Grandmother    Colon cancer Neg  Hx    Colon polyps Neg Hx    Inflammatory bowel disease Neg Hx     Social History   Socioeconomic History   Marital status: Single    Spouse name: Not on file   Number of children: Not on file   Years of education: Not on file   Highest education level: Not on file  Occupational History   Not on file  Tobacco Use   Smoking status: Former Smoker    Types: Cigarettes   Smokeless tobacco: Former Systems developer    Types: Chew   Tobacco comment: no cigarettes in 4 years,   Vaping Use   Vaping Use: Never used  Substance and Sexual Activity   Alcohol use: Not Currently    Comment: rare   Drug use: Not Currently  Comment: pain pills 5 years ago, none since then.    Sexual activity: Not on file  Other Topics Concern   Not on file  Social History Narrative   Not on file   Social Determinants of Health   Financial Resource Strain:    Difficulty of Paying Living Expenses:   Food Insecurity:    Worried About [place] in the Last Year:    Arboriculturist in the Last Year:   Transportation Needs:    Film/video editor (Medical):    Lack of Transportation (Non-Medical):   Physical Activity:    Days of Exercise per Week:    Minutes of Exercise per Session:   Stress:    Feeling of Stress :   Social Connections:    Frequency of Communication with Friends and Family:    Frequency of Social Gatherings with Friends and Family:    Attends Religious Services:    Active Member of Clubs or Organizations:    Attends Music therapist:    Marital Status:     Review of Systems: Gen: See HPI CV: Denies chest pain or palpitations Resp: Denies dyspnea or cough GI: See HPI Derm: Denies rash Psych: Denies depression or anxiety Heme: See HPI  Physical Exam: BP 130/80    Pulse 80    Temp (!) 97.5 F (36.4 C) (Oral)    Ht 5' 7"  (1.702 m)    Wt 129 lb 14.4 oz (58.9 kg)    BMI 20.35 kg/m  General:   Alert and oriented. No distress  noted. Pleasant and cooperative.  Head:  Normocephalic and atraumatic. Eyes:  Conjuctiva clear without scleral icterus. Heart:  S1, S2 present without murmurs appreciated. Lungs:  Clear to auscultation bilaterally. No wheezes, rales, or rhonchi. No distress.  Abdomen:  +BS, soft, and non-distended. Very minimal TTP of left abdomen (improved). No rebound or guarding. No HSM or masses noted. Msk:  Symmetrical without gross deformities. Normal posture. Extremities:  Without edema. Neurologic:  Alert and  oriented x4 Psych:  Normal mood and affect.

## 2019-09-09 ENCOUNTER — Ambulatory Visit (INDEPENDENT_AMBULATORY_CARE_PROVIDER_SITE_OTHER): Payer: BC Managed Care – PPO | Admitting: Gastroenterology

## 2019-09-09 ENCOUNTER — Encounter: Payer: Self-pay | Admitting: Gastroenterology

## 2019-09-09 ENCOUNTER — Other Ambulatory Visit: Payer: Self-pay

## 2019-09-09 VITALS — BP 130/80 | HR 80 | Temp 97.5°F | Ht 67.0 in | Wt 129.9 lb

## 2019-09-09 DIAGNOSIS — K51011 Ulcerative (chronic) pancolitis with rectal bleeding: Secondary | ICD-10-CM

## 2019-09-09 MED ORDER — PREDNISONE 10 MG PO TABS: ORAL_TABLET | ORAL | 0 refills | Status: DC

## 2019-09-09 NOTE — Patient Instructions (Signed)
Please have labs completed.  Continue prednisone 40 mg x 7 days.  We will start to taper you after this.  We will plan for 30 mg x 7 days, then 20 mg x 7 days, then 10 mg x 7 days, then 5 mg x 7 days.  We may need to adjust this taper.  Please call me in 2 weeks with a progress report.  Please continue Humira as planned.  Your next injection should be 7/15.  We will plan to see back in the office in 4 weeks.  Do not hesitate to call with questions or concerns prior.  Aliene Altes, PA-C [facility]

## 2019-09-10 ENCOUNTER — Encounter: Payer: Self-pay | Admitting: Internal Medicine

## 2019-09-10 ENCOUNTER — Ambulatory Visit: Payer: BC Managed Care – PPO | Admitting: Nurse Practitioner

## 2019-09-10 ENCOUNTER — Encounter: Payer: Self-pay | Admitting: Gastroenterology

## 2019-09-10 NOTE — Assessment & Plan Note (Addendum)
29 year old male diagnosed with ulcerative colitis in April 2021 via colonoscopy which was pursued due to diarrhea and rectal bleeding.  Colonoscopy found inflamed rectum and left colon with focal inflammation of cecum s/p biopsy. Pathology with mild to moderate active colitis in the cecum, descending colon, sigmoid colon, and rectum. Patient has had a very difficult time achieving improvement in symptoms. He tried and failed Entocort, Rowasa enemas, and Lialda.  He was started on prednisone on 5/21 with attempt to taper but no improvement in symptoms and patient ultimately ended up in the ED 08/15/2019 due to worsening abdominal pain and frequent bloody stools with intermittent fever.  Hemoglobin had drifted down to 11.6, CT A/P with contrast with worsening colitis, wall thickening with pericolonic edema extending from the hepatic flexure of the colon through the rectum.  He was on prednisone 20 mg at that time.  He has increased back to 40 mg daily.  Stool studies were checked to rule out possible infectious colitis as symptoms were not improving and these were negative.  Prednisone briefly increased to 50 mg daily on 6/24 x 1 week then back to 40 mg daily..  We ultimately pursued biologic therapy after completing all pretreatment labs.  He received Humira 6/29 and was to give himself two 80 mg injections.  Unfortunately, the first injection was not entirely successful but the second injection was.  Patient has finally started to notice some improvement in abdominal pain, stool frequency/consistency, and rectal bleeding although he does continue to have abdominal pain, diarrhea, and bloody BMs daily, typically worse at night.  Also notes getting fatigued easily with weakness.  Weight loss of 29 pounds since March 2021.  Plan: Update CBC and obtain iron panel.  Concern for worsening anemia contributing to weakness/fatigue. As patient is still fairly symptomatic, I have advised to continue prednisone 40 mg 1 more  week which will bridge him to his next Humira injection.  We will then plan to start tapering prednisone by 10 mg/week. However, requested patient to call in 2 weeks with a progress report as we may need to taper more slowly.   Continue Humira as planned.  Next injection should be 80 mg on 7/15.  Then he will start 40 mg every 2 weeks. We will plan to follow-up with him in the office in 4 weeks.  Advised to call with any worsening symptoms or other questions or concerns prior.

## 2019-09-23 ENCOUNTER — Telehealth: Payer: Self-pay | Admitting: Internal Medicine

## 2019-09-23 NOTE — Telephone Encounter (Signed)
Lmom, waiting on a return call.  

## 2019-09-23 NOTE — Telephone Encounter (Signed)
Is he on Prednisone 40 mg or 30 mg now?   Also please remind him to have his labs completed.

## 2019-09-23 NOTE — Telephone Encounter (Signed)
Spoke with pt. Pt called to give a progress report.  Pt states that he feels the exact same as he did at his appointment. Pts pain level is at a 5 when ever he has pain. The pain isn't constant anymore like it had been per pt. Pt believes the Humira is starting to work. Please advise on tampering down on pts prednisone.

## 2019-09-23 NOTE — Telephone Encounter (Signed)
Pt called with questions about if he needed to start tampering down on his prednisone. Please advise. [phone]

## 2019-09-24 NOTE — Telephone Encounter (Signed)
Lmom, waiting on a return call.  

## 2019-09-28 ENCOUNTER — Telehealth: Payer: Self-pay | Admitting: Emergency Medicine

## 2019-09-28 NOTE — Telephone Encounter (Signed)
How long has he been on prednisone 30 mg?   How is he feeling? Number of BMs?  Has he continued with BRBPR? How is his abdominal pain?

## 2019-09-28 NOTE — Telephone Encounter (Addendum)
Pt also states he has noticed a rash on his back. He states the rash has been there for about 4 days and he is not for sure if it is from the humara or not. Theres not redness or irritation around the injection site and that the rash does not itch. Pt is not sure if he needs to be concerned

## 2019-09-28 NOTE — Telephone Encounter (Signed)
Pt called and said he is taking prednisone 59m and wants to know if he should taper down to 242m

## 2019-09-29 NOTE — Telephone Encounter (Signed)
It's hard to say what this rash may be from. Is there any way he can take a picture and upload it to [facility]? He can send it through a patient message for me to take a look at.

## 2019-09-29 NOTE — Telephone Encounter (Signed)
Noted. He can go ahead and reduce Prednisone to 20 mg daily. Plan to continue this for 1 week then call with a progress report prior to tapering further.

## 2019-09-29 NOTE — Telephone Encounter (Signed)
How long has he been on prednisone 30 mg?  Pt stated he has been on prednisone 49m for 11 days    How is he feeling? Pt states he is feeling really good  Number of BMs? Pt states he is having 4 bms  a day sometimes 5 or 6   Has he continued with BRBPR? Pt states he is having blood in his stool but it is not often. Pt did see some blood in his stool 2 days ago but states that may have been something he ate   How is his abdominal pain?  His pain level is a 3 or 4 but it is not constant. The pain is usually at night. Pt states the pain is not that bad

## 2019-09-29 NOTE — Telephone Encounter (Signed)
Pt notified of recommendations

## 2019-09-30 NOTE — Telephone Encounter (Signed)
Pt stated he does not have a my chart but he will try to set one up to send the picture of his rash

## 2019-10-01 ENCOUNTER — Telehealth: Payer: Self-pay | Admitting: Emergency Medicine

## 2019-10-01 NOTE — Telephone Encounter (Signed)
Pt called and wanted to know if his humara will get delivered to his house or did he need to pick it up from the pharmacy. Notified pt that the pharmacy will mail it to him. Pt stated he understood and thanked me for the call

## 2019-10-06 ENCOUNTER — Telehealth: Payer: Self-pay | Admitting: Emergency Medicine

## 2019-10-06 NOTE — Telephone Encounter (Signed)
Tried to call patient to discuss how he was doing. Left a voice mail. Would like to see how his abdominal pain is, number of bowel movements, consistency, and if he continues with rectal bleeding.

## 2019-10-06 NOTE — Telephone Encounter (Signed)
Magda Paganini, it looks like [employer] sent this to me by mistake.

## 2019-10-06 NOTE — Telephone Encounter (Signed)
Pt also stated he will be out of his prednisone soon

## 2019-10-06 NOTE — Telephone Encounter (Signed)
Noted. He is definitely continuing to improve which is encouraging. We will start decreasing prednisone by 5 mg per week at this tie. Lets go ahead and decrease prednisone to 15 mg x 1 week once he receives his next Humira injection. Hopefully he will receive this in the mail tomorrow. He should call again in 1 week with a progress report. Does he have enough prednisone for another week or does he need a refill now?

## 2019-10-06 NOTE — Telephone Encounter (Signed)
Pt returned call and stated his abd pain is a 2 out of a 10. Hes having about 5 bm's a day and  it has been diarrhea. Denies any rectal bleeding

## 2019-10-06 NOTE — Telephone Encounter (Signed)
Pt called and stated he hasn't received his humaria in the mail yet and it has been two days  [place], [place] Phone:  [phone]    They stated they had not processed the humira 40 mg yet and they will over night the medication. To pt. Notified pt and he voiced understanding. Also gave pt pharmacy info to follow up. Pt also stated he has been on predisone for 7 days and wants to know if he can taper down to 9m tomorrow or if  He needs to stay on the 234m

## 2019-10-07 ENCOUNTER — Other Ambulatory Visit: Payer: Self-pay | Admitting: Gastroenterology

## 2019-10-07 DIAGNOSIS — K51011 Ulcerative (chronic) pancolitis with rectal bleeding: Secondary | ICD-10-CM

## 2019-10-07 MED ORDER — PREDNISONE 10 MG PO TABS: ORAL_TABLET | ORAL | 0 refills | Status: DC

## 2019-10-07 NOTE — Telephone Encounter (Signed)
Rx for prednisone sent to pharmacy.

## 2019-10-07 NOTE — Telephone Encounter (Signed)
Called notified pt of providers recommendations Pt stated she will call in one week with progress  Pt also stated he is out of prednisone please send in rx to [facility] on [address]

## 2019-10-07 NOTE — Telephone Encounter (Signed)
noted 

## 2019-10-09 ENCOUNTER — Ambulatory Visit: Payer: BC Managed Care – PPO | Admitting: Gastroenterology

## 2019-10-19 ENCOUNTER — Other Ambulatory Visit: Payer: Self-pay | Admitting: Gastroenterology

## 2019-10-19 ENCOUNTER — Telehealth: Payer: Self-pay

## 2019-10-19 DIAGNOSIS — K51011 Ulcerative (chronic) pancolitis with rectal bleeding: Secondary | ICD-10-CM

## 2019-10-19 NOTE — Telephone Encounter (Signed)
Pt called to let Aliene Altes, PA know that he is on his last dose of Prednisone. Pt would like a refill sent to his pharmacy.

## 2019-10-19 NOTE — Telephone Encounter (Signed)
I sent a refill to his pharmacy on 8/4 with instructions for a 3 week taper.

## 2019-10-19 NOTE — Progress Notes (Signed)
error 

## 2019-10-19 NOTE — Telephone Encounter (Signed)
Spoke with pt. Pt states he only received 10 pills. I contacted the pharmacy and they gave pt 21 tabs of Prednisone. Please advise.

## 2019-10-19 NOTE — Telephone Encounter (Signed)
Please find out what dose of prednisone he is taking. He should be taking 10 mg daily at this point and only have 3-4 days left before he is to decrease to 5m daily x7 days.

## 2019-10-20 ENCOUNTER — Telehealth: Payer: Self-pay | Admitting: Internal Medicine

## 2019-10-20 ENCOUNTER — Other Ambulatory Visit: Payer: Self-pay | Admitting: Gastroenterology

## 2019-10-20 DIAGNOSIS — K51011 Ulcerative (chronic) pancolitis with rectal bleeding: Secondary | ICD-10-CM

## 2019-10-20 MED ORDER — PREDNISONE 10 MG PO TABS: ORAL_TABLET | ORAL | 0 refills | Status: DC

## 2019-10-20 NOTE — Telephone Encounter (Signed)
Spoke with patient.  He had trouble getting his dose of Humira which was originally scheduled for August 1 and not received the medication until August 10.  He continued taking 20 mg of prednisone until he received his Humira on August 10.  He is currently taking 10 mg of prednisone starting today and will be on 10 mg of prednisone x7 days then 5 mg of prednisone x7 days.  Clinically he is doing very well.  Having 1 BM daily and 1 BM nightly.  Stools are more solid.  No abdominal pain.  Minimal rectal bleeding.  Eating normally. Going to work and also working out.   I am sending in Rx for prednisone 10 mg x7 days and 87m x7 days.   ACandace Cruise

## 2019-10-20 NOTE — Telephone Encounter (Signed)
Lmom, waiting on a return call.  

## 2019-10-20 NOTE — Telephone Encounter (Signed)
Pt called asking to speak to Aliene Altes, PA directly and no one else regarding his humira. [phone]

## 2019-10-20 NOTE — Telephone Encounter (Signed)
Noted  

## 2019-11-14 NOTE — Progress Notes (Deleted)
Referring Provider: Caryl Bis, MD Primary Care Physician:  Caryl Bis, MD Primary GI Physician: Dr. Gala Romney  No chief complaint on file.   HPI:   Michael Mcdowell is a 29 y.o. male presenting today for follow-up of UC.   He was diagnosed with UC in April 2021.  He was having significant diarrhea and rectal bleeding.  Colonoscopy revealed inflamed rectum and left colon with focal inflammation in the cecum.  Due to trying and failing several therapies, patient was ultimately started on Humira on 6/29 with standard induction of 160 mg [place] x1, then 80 mg x1 on day 15, then 40 mg [place] q2 weeks. Notably, he was still on prednisone at that time.   He was last seen in our office 09/09/2019.  He was doing okay with Humira injections.  He was still on prednisone 40 mg with plans to start 30 mg the following day.  He was noticing some clinical improvement.  Down to 4-5 BMs during the day but continues with frequent BMs at night.  Stool consistency was improving.  Rectal bleeding improving.  Abdominal pain continued at nighttime.  He changes eating habits and was following a low fiber and low-fat diet.  Continue to get weak and fatigued quickly.  Have recently returned to work.  Plan to update CBC and iron panel, continue prednisone 40 mg 1 more week then start to taper, continue Humira as planned, follow-up in 4 weeks.  Labs were never completed.  Several telephone calls touching base with patient to discuss symptoms while tapering prednisone.  Last discussion on 10/20/2019.  He was starting prednisone 10 mg with plans for 7-day course then 5 mg x 7 days then stop.  Clinically he was doing very well with 1 BM daily and 1 BM nightly.  Stools were solid.  No abdominal pain.  Minimal rectal bleeding.  Eating well, going to work, and working out.  Today:    Update CBC and CMP.   Past Medical History:  Diagnosis Date  . Substance abuse in remission ([place])   . Ulcerative colitis ([place]) 06/2019     Past Surgical History:  Procedure Laterality Date  . BIOPSY  06/08/2019   Procedure: BIOPSY;  Surgeon: Daneil Dolin, MD;  [facility];  Service: Endoscopy;;  . COLONOSCOPY WITH PROPOFOL N/A 06/08/2019   Inflamed rectum and left colon likely representing UC in evolution. Cecal: midly active colitis, de=scending colon with moderately active colitis, sigmoid with moderately active colitis, rectum with moderately active colitis  . Tyhroglossal duct cyst      Current Outpatient Medications  Medication Sig Dispense Refill  . acetaminophen (TYLENOL) 500 MG tablet Take 1,000 mg by mouth as needed for moderate pain.     . Adalimumab (HUMIRA PEN-CD/UC/HS STARTER) 80 MG/0.8ML PNKT Inject 160 mg into the skin on day 1, then inject 80 mg into the skin on day 15, then stop. On day 29, start 40 mg into the skin every 2 weeks (this is the maintenance dose-see separate prescription for 40 mg pen) 3 each 0  . Adalimumab (HUMIRA) 40 MG/0.8ML PSKT Inject 40 mg into the skin every 14 days starting on day 29. See starter pack for dosing on day 1 and day 15. 2 each 2  . predniSONE (DELTASONE) 10 MG tablet Take 1 tablet (10 mg total) daily x7 days, then take 1/2 tablet (49m total) daily x7 days then stop. 11 tablet 0   No current facility-administered medications  for this visit.    Allergies as of 11/16/2019 - Review Complete 09/09/2019  Allergen Reaction Noted  . Doxycycline Other (See Comments) 08/31/2014    Family History  Problem Relation Age of Onset  . Cancer Paternal Grandmother   . Colon cancer Neg Hx   . Colon polyps Neg Hx   . Inflammatory bowel disease Neg Hx     Social History   Socioeconomic History  . Marital status: Single    Spouse name: Not on file  . Number of children: Not on file  . Years of education: Not on file  . Highest education level: Not on file  Occupational History  . Not on file  Tobacco Use  . Smoking status: Former Smoker    Types: Cigarettes  .  Smokeless tobacco: Former Systems developer    Types: Chew  . Tobacco comment: no cigarettes in 4 years,   Vaping Use  . Vaping Use: Never used  Substance and Sexual Activity  . Alcohol use: Not Currently    Comment: rare  . Drug use: Not Currently    Comment: pain pills 5 years ago, none since then.   . Sexual activity: Not on file  Other Topics Concern  . Not on file  Social History Narrative  . Not on file   Social Determinants of Health   Financial Resource Strain:   . Difficulty of Paying Living Expenses: Not on file  Food Insecurity:   . Worried About Charity fundraiser in the Last Year: Not on file  . Ran Out of Food in the Last Year: Not on file  Transportation Needs:   . Lack of Transportation (Medical): Not on file  . Lack of Transportation (Non-Medical): Not on file  Physical Activity:   . Days of Exercise per Week: Not on file  . Minutes of Exercise per Session: Not on file  Stress:   . Feeling of Stress : Not on file  Social Connections:   . Frequency of Communication with Friends and Family: Not on file  . Frequency of Social Gatherings with Friends and Family: Not on file  . Attends Religious Services: Not on file  . Active Member of Clubs or Organizations: Not on file  . Attends Archivist Meetings: Not on file  . Marital Status: Not on file    Review of Systems: Gen: Denies fever, chills, anorexia. Denies fatigue, weakness, weight loss.  CV: Denies chest pain, palpitations, syncope, peripheral edema, and claudication. Resp: Denies dyspnea at rest, cough, wheezing, coughing up blood, and pleurisy. GI: Denies vomiting blood, jaundice, and fecal incontinence.   Denies dysphagia or odynophagia. Derm: Denies rash, itching, dry skin Psych: Denies depression, anxiety, memory loss, confusion. No homicidal or suicidal ideation.  Heme: Denies bruising, bleeding, and enlarged lymph nodes.  Physical Exam: There were no vitals taken for this visit. General:    Alert and oriented. No distress noted. Pleasant and cooperative.  Head:  Normocephalic and atraumatic. Eyes:  Conjuctiva clear without scleral icterus. Mouth:  Oral mucosa pink and moist. Good dentition. No lesions. Heart:  S1, S2 present without murmurs appreciated. Lungs:  Clear to auscultation bilaterally. No wheezes, rales, or rhonchi. No distress.  Abdomen:  +BS, soft, non-tender and non-distended. No rebound or guarding. No HSM or masses noted. Msk:  Symmetrical without gross deformities. Normal posture. Extremities:  Without edema. Neurologic:  Alert and  oriented x4 Psych:  Alert and cooperative. Normal mood and affect.

## 2019-11-16 ENCOUNTER — Ambulatory Visit: Payer: BC Managed Care – PPO | Admitting: Gastroenterology

## 2019-11-16 ENCOUNTER — Encounter: Payer: Self-pay | Admitting: Internal Medicine

## 2020-01-12 ENCOUNTER — Encounter: Payer: Self-pay | Admitting: Internal Medicine

## 2020-01-12 ENCOUNTER — Ambulatory Visit: Payer: BC Managed Care – PPO | Admitting: Gastroenterology

## 2020-01-12 NOTE — Progress Notes (Deleted)
diagnosed ulcerative colitis in April 2021 via colonoscopy which was pursued due to diarrhea and rectal bleeding. Colonoscopy found inflamed rectum and left colon with focal inflammation in the cecum likely representing UC in evolution s/p biopsy. Surgical pathology confirmed apparent diagnosis of IBD with mild to moderate active colitis in the cecum, descending colon, sigmoid colon, rectum.  Due to failure of Entocort, [place] enemas, and Lialda, he was started on Humira 6/29. On prollonged prednisone course with taper while bridging to Humira.

## 2020-02-04 ENCOUNTER — Telehealth: Payer: Self-pay | Admitting: Internal Medicine

## 2020-02-04 NOTE — Telephone Encounter (Signed)
[phone]  Please call patient, he received a bill from [facility] and was told to call us about the coding

## 2020-02-04 NOTE — Telephone Encounter (Signed)
Spoke with pt. Pt is going to call back when he is able to talk.

## 2020-02-15 ENCOUNTER — Telehealth: Payer: Self-pay | Admitting: Internal Medicine

## 2020-02-15 DIAGNOSIS — M2550 Pain in unspecified joint: Secondary | ICD-10-CM | POA: Diagnosis not present

## 2020-02-15 DIAGNOSIS — M25512 Pain in left shoulder: Secondary | ICD-10-CM | POA: Diagnosis not present

## 2020-02-15 DIAGNOSIS — M25511 Pain in right shoulder: Secondary | ICD-10-CM | POA: Diagnosis not present

## 2020-02-15 NOTE — Telephone Encounter (Signed)
PATIENT RETURNED CALL, PLEASE CALL BACK WHEN YOU CAN

## 2020-02-15 NOTE — Telephone Encounter (Signed)
Spoke with pt. [facility] is the lab that is asking for another billing code from pt. Pt received a bill and isn't sure what lab wasn't covered under his insurance. Pt is aware that I will contact [facility] to see if the information can be given to discuss addition dx codes.

## 2020-02-16 NOTE — Telephone Encounter (Signed)
Spoke with [place] from [employer] [phone]. Addition lab codes were given for date of service 08/07/19 (Z79.899, Z11.59, K51.911). per Kenyatta, it may take 30 days for billing to be corrected. Spoke with pt and he is aware that additional dx codes were given to correct billing for pt.

## 2020-03-08 ENCOUNTER — Other Ambulatory Visit: Payer: Self-pay | Admitting: Gastroenterology

## 2020-03-08 DIAGNOSIS — K51911 Ulcerative colitis, unspecified with rectal bleeding: Secondary | ICD-10-CM

## 2020-03-14 DIAGNOSIS — M25511 Pain in right shoulder: Secondary | ICD-10-CM | POA: Diagnosis not present

## 2020-03-14 DIAGNOSIS — M25512 Pain in left shoulder: Secondary | ICD-10-CM | POA: Diagnosis not present

## 2020-05-12 ENCOUNTER — Ambulatory Visit
Admission: EM | Admit: 2020-05-12 | Discharge: 2020-05-12 | Disposition: A | Discharge status: Home / Self Care | Payer: BC Managed Care – PPO | Attending: Emergency Medicine | Admitting: Emergency Medicine

## 2020-05-12 ENCOUNTER — Other Ambulatory Visit: Payer: Self-pay

## 2020-05-12 ENCOUNTER — Encounter: Payer: Self-pay | Admitting: Emergency Medicine

## 2020-05-12 DIAGNOSIS — R21 Rash and other nonspecific skin eruption: Secondary | ICD-10-CM

## 2020-05-12 DIAGNOSIS — L089 Local infection of the skin and subcutaneous tissue, unspecified: Secondary | ICD-10-CM

## 2020-05-12 DIAGNOSIS — L299 Pruritus, unspecified: Secondary | ICD-10-CM

## 2020-05-12 MED ORDER — PREDNISONE 20 MG PO TABS: 20.0000 mg | ORAL_TABLET | Freq: Two times a day (BID) | ORAL | 0 refills | Status: AC

## 2020-05-12 MED ORDER — CEPHALEXIN 500 MG PO CAPS
500.0000 mg | ORAL_CAPSULE | Freq: Three times a day (TID) | ORAL | 0 refills | Status: AC
Start: 2020-05-12 — End: 2020-05-22

## 2020-05-12 NOTE — ED Provider Notes (Signed)
[facility]   [id number] 05/12/20 Arrival Time: 1211  CC: Rash  SUBJECTIVE:  Michael Mcdowell is a 30 y.o. male who presents with a RASH to LT forearm x 1 week.  Received a tattoo to LT forearm 2 weeks ago.   Describes it as mildly itchy, not painful.  Has tried tattoo aftercare A&D ointment without relief.  Symptoms are made worse with friction from clothes.  Denies similar symptoms in the past.   Denies fever, chills, nausea, vomiting, swelling, discharge.  ROS: As per HPI.  All other pertinent ROS negative.     Past Medical History:  Diagnosis Date  . Substance abuse in remission ([place])   . Ulcerative colitis ([place]) 06/2019   Past Surgical History:  Procedure Laterality Date  . BIOPSY  06/08/2019   Procedure: BIOPSY;  Surgeon: Daneil Dolin, MD;  [facility];  Service: Endoscopy;;  . COLONOSCOPY WITH PROPOFOL N/A 06/08/2019   Inflamed rectum and left colon likely representing UC in evolution. Cecal: midly active colitis, de=scending colon with moderately active colitis, sigmoid with moderately active colitis, rectum with moderately active colitis  . Tyhroglossal duct cyst     Allergies  Allergen Reactions  . Doxycycline Other (See Comments)    Possible reaction.  Pt not sure.   No current facility-administered medications on file prior to encounter.   Current Outpatient Medications on File Prior to Encounter  Medication Sig Dispense Refill  . acetaminophen (TYLENOL) 500 MG tablet Take 1,000 mg by mouth as needed for moderate pain.     . Adalimumab (HUMIRA) 40 MG/0.8ML PSKT INJECT 40 MG UNDER THE SKIN EVERY 14 DAYS 2 each 5   Social History   Socioeconomic History  . Marital status: Single    Spouse name: Not on file  . Number of children: Not on file  . Years of education: Not on file  . Highest education level: Not on file  Occupational History  . Not on file  Tobacco Use  . Smoking status: Former Smoker    Types: Cigarettes  . Smokeless  tobacco: Former Systems developer    Types: Chew  . Tobacco comment: no cigarettes in 4 years,   Vaping Use  . Vaping Use: Never used  Substance and Sexual Activity  . Alcohol use: Not Currently    Comment: rare  . Drug use: Not Currently    Comment: pain pills 5 years ago, none since then.   . Sexual activity: Not on file  Other Topics Concern  . Not on file  Social History Narrative  . Not on file   Social Determinants of Health   Financial Resource Strain: Not on file  Food Insecurity: Not on file  Transportation Needs: Not on file  Physical Activity: Not on file  Stress: Not on file  Social Connections: Not on file  Intimate Partner Violence: Not on file   Family History  Problem Relation Age of Onset  . Cancer Paternal Grandmother   . Colon cancer Neg Hx   . Colon polyps Neg Hx   . Inflammatory bowel disease Neg Hx     OBJECTIVE: Vitals:   05/12/20 1218  BP: (!) 166/72  Pulse: 72  Resp: 18  Temp: 98.4 F (36.9 C)  TempSrc: Oral  SpO2: 97%    General appearance: alert; no distress Head: NCAT Lungs: normal respiratory effort CV: Radial pulse 2+  Extremities: no edema Skin: warm and dry; tattoo present to LT forearm, sparse pustules/ papules with surrounding  erythema to anterior LT forearm, overlying tattoo, NTTP, no obvious drainage or bleeding Psychological: alert and cooperative; normal mood and affect  ASSESSMENT & PLAN:  1. Rash and nonspecific skin eruption   2. Pustular inflammation of skin   3. Itching     Meds ordered this encounter  Medications  . predniSONE (DELTASONE) 20 MG tablet    Sig: Take 1 tablet (20 mg total) by mouth 2 (two) times daily with a meal for 5 days.    Dispense:  10 tablet    Refill:  0    Order Specific Question:   Supervising Provider    Answer:   Raylene Everts [[id number]]  . cephALEXin (KEFLEX) 500 MG capsule    Sig: Take 1 capsule (500 mg total) by mouth 3 (three) times daily for 10 days.    Dispense:  30 capsule     Refill:  0    Order Specific Question:   Supervising Provider    Answer:   Raylene Everts [[id number]]   Wash site daily with warm water and mild soap Keep covered to avoid friction Take antibiotic as prescribed and to completion Take prednisone as prescribed.  Take as directed and to completion Follow up here or with PCP if symptoms persists Return or go to the ED if you have any new or worsening symptoms redness, swelling, drainage, bleeding, increased redness, swelling, etc...  Reviewed expectations re: course of current medical issues. Questions answered. Outlined signs and symptoms indicating need for more acute intervention. Patient verbalized understanding. After Visit Summary given.   Lestine Box, PA-C 05/12/20 1249

## 2020-05-12 NOTE — ED Triage Notes (Signed)
A week after having a tattoo finished he noticed some red bumps appear at the tattoo area.

## 2020-05-12 NOTE — Discharge Instructions (Addendum)
Wash site daily with warm water and mild soap Keep covered to avoid friction Take antibiotic as prescribed and to completion Take prednisone as prescribed.  Take as directed and to completion Follow up here or with PCP if symptoms persists Return or go to the ED if you have any new or worsening symptoms redness, swelling, drainage, bleeding, increased redness, swelling, etc..Marland Kitchen

## 2020-06-03 DIAGNOSIS — H6122 Impacted cerumen, left ear: Secondary | ICD-10-CM | POA: Diagnosis not present

## 2020-08-23 ENCOUNTER — Telehealth: Payer: Self-pay | Admitting: Internal Medicine

## 2020-08-23 NOTE — Telephone Encounter (Signed)
[phone] PATIENT CALLED AND SAID THAT [facility] NEEDS TO BE CALLED. THEY NEED VERBAL CLARIFICATION TO REFILL HIS PRESCRIPTION.

## 2020-08-25 NOTE — Telephone Encounter (Signed)
Was going into pt's chart to chart where I had done a PA (yesterday) on his Humira 29m/.8ml syringe kit and seen the note from 08/23/2020 which was not sent to me (went to [facility] and it wasn't addressed. I will call [facility] @ [phone] the medication has been approved and then follow up with the pt. Will have KAliene Altesto sign paperwork if needed.

## 2020-08-29 ENCOUNTER — Telehealth: Payer: Self-pay | Admitting: Internal Medicine

## 2020-08-29 NOTE — Telephone Encounter (Signed)
PATIENT CALLED ASKING TO SPEAK TO A NURSE, PLEASE CALL

## 2020-08-29 NOTE — Telephone Encounter (Signed)
[employer] has sent a authorization notification to [place] regarding reference number [id number] for Humira 40MG/0.8ML [place] PSKT. The dates are effective for authorization 08/25/2020-Aug 24, 2021.  Dx codes used for this was K51.90 and K51.011

## 2020-08-31 ENCOUNTER — Telehealth: Payer: Self-pay | Admitting: Internal Medicine

## 2020-08-31 NOTE — Telephone Encounter (Signed)
Since Cyril Mourning is on vacation i'm sending this to you. I just called the pt and I asked him regarding how he takes his medication and he states the same dose, same everything that it never changes. So can you please call [facility] to give a verbal authorization for this pt to receive his medication.

## 2020-08-31 NOTE — Telephone Encounter (Signed)
From my understanding he knows what to do because he has been on it for awhile. I was trying to wait until Cyril Mourning returned but the pt wanted to get his meds and get started. I also routed this to [place] as well.

## 2020-08-31 NOTE — Telephone Encounter (Signed)
Patient called and said that he has been out of his medications for 3 months and he has not had a return call since last Tuesday

## 2020-08-31 NOTE — Telephone Encounter (Signed)
Was going into pt's chart to chart where I had done a PA (yesterday) on his Humira 40mg/.8ml syringe kit and seen the note from 08/23/2020 which was not sent to me (went to [facility]) and it wasn't addressed. I will call [facility] @ [phone] once the medication has been approved and then follow up with the pt. Will have Kristen Harper to sign paperwork if needed. 

## 2020-08-31 NOTE — Telephone Encounter (Signed)
Noted. Do I need to do anything else?

## 2020-08-31 NOTE — Telephone Encounter (Signed)
Phoned to [place] spoke with Maryanna Shape, RN, MSN @ [phone] ext [phone] and advised her that the pt was approved to get Humira and gave the authorization for the dates effective for and authorization number. She advises she will contact pharmacy and have them ship to the pt.

## 2020-09-04 NOTE — Telephone Encounter (Signed)
Can we follow-up with patient to ensure he has received his medication?

## 2020-09-06 NOTE — Telephone Encounter (Signed)
Phoned and LMOVM for the pt to return the call.

## 2020-09-07 NOTE — Telephone Encounter (Signed)
Phoned the pt, no answer. LMOVM for the to return the call regarding his medications.

## 2020-09-08 NOTE — Telephone Encounter (Signed)
Noted. He should let us know if he doesn't receive the medication. Additionally, patient needs to be scheduled for a follow-up visit. He hasn't been seen since July 2021. (He cancelled 2 appts and no showed to his last 2 appts).   Stacey: Please arrange a follow-up.

## 2020-09-08 NOTE — Telephone Encounter (Signed)
Phoned the pt and advised him that if he doesn't receive his medication by Monday to please call us. Pt agreed.

## 2020-09-08 NOTE — Telephone Encounter (Signed)
The pt returned call, and he has not received his medication as of yet. [place]. He stated that he had spoke with the delivery that ships the medication to his house and they had supposedly put it down that the pt requested a 3 to 4 day shipping because he was going to the beach. They have what is needed from [place], the pt has put in the time frame for them to bring out medication again incase they missed him.

## 2020-09-12 ENCOUNTER — Encounter: Payer: Self-pay | Admitting: Internal Medicine

## 2020-09-21 NOTE — Telephone Encounter (Signed)
FYI: Pt Michael Mcdowell yesterday regarding his Humira. I phoned the pt back and was advised by him that he hasn't received his medication. I asked him had he called them to find out what was going on since he returned and he said no. I asked him when he set up the date and time with them to ship out his medication did he advise them that he was going to the beach, he replied no. He stated they always leave it on the porch. I advised him to call and see what happen because he sets up the time for them to deliver to his house.

## 2020-09-21 NOTE — Telephone Encounter (Signed)
I called [facility]. Spoke with Butch Penny who said the prescription for Humira is ready for delivery, the patient just needs to call to schedule a time for delivery. I spoke with patient. Stated he hasn't called [facility] to arrange a delivery because he thought [facility] was waiting on approval from [place]. Explained that Rx is ready for delivery, he just needs to call and arrange. He voiced understand and stated he would call to schedule delivery.

## 2020-10-27 DIAGNOSIS — F4323 Adjustment disorder with mixed anxiety and depressed mood: Secondary | ICD-10-CM | POA: Diagnosis not present

## 2020-11-03 ENCOUNTER — Encounter: Payer: Self-pay | Admitting: Emergency Medicine

## 2020-11-03 ENCOUNTER — Ambulatory Visit
Admission: EM | Admit: 2020-11-03 | Discharge: 2020-11-03 | Disposition: A | Discharge status: Home / Self Care | Payer: BC Managed Care – PPO | Attending: Emergency Medicine | Admitting: Emergency Medicine

## 2020-11-03 ENCOUNTER — Other Ambulatory Visit: Payer: Self-pay

## 2020-11-03 DIAGNOSIS — Z202 Contact with and (suspected) exposure to infections with a predominantly sexual mode of transmission: Secondary | ICD-10-CM | POA: Diagnosis not present

## 2020-11-03 MED ORDER — AZITHROMYCIN 500 MG PO TABS
1000.0000 mg | ORAL_TABLET | Freq: Once | ORAL | Status: AC
  Administered 2020-11-03: 1000 mg via ORAL

## 2020-11-03 NOTE — Discharge Instructions (Addendum)
Azithromycin given in office Cytology obtained  Declines HIV/ syphilis testing today We will follow up with you regarding the results of your test If tests are positive, please abstain from sexual activity until you and your partner(s) are treated Follow up with PCP if symptoms persists Return here or go to ER if you have any new or worsening symptoms fever, chills, nausea, vomiting, redness, swelling, drainage, etc..Marland Kitchen

## 2020-11-03 NOTE — ED Provider Notes (Signed)
[facility]   [id number] 11/03/20 Arrival Time: 30   [id number] FOR STD  SUBJECTIVE:  Michael Mcdowell is a 30 y.o. male who presents requesting STI screening.  Currently asymptomatic.  Partner dx'ed with chlamydia.  Last unprotected sexual encounter 3 weeks ago.  Sexually active with 2 partners over the past 6 months  Reports STD 7 years ago.  Denies fever, chills, nausea, vomiting, abdominal or pelvic pain, penile rashes or lesions, testicular swelling or pain.     No LMP for male patient.  ROS: As per HPI.  All other pertinent ROS negative.     Past Medical History:  Diagnosis Date   Substance abuse in remission [facility])    Ulcerative colitis ([place]) 06/2019   Past Surgical History:  Procedure Laterality Date   BIOPSY  06/08/2019   Procedure: BIOPSY;  Surgeon: Daneil Dolin, MD;  [facility];  Service: Endoscopy;;   COLONOSCOPY WITH PROPOFOL N/A 06/08/2019   Inflamed rectum and left colon likely representing UC in evolution. Cecal: midly active colitis, de=scending colon with moderately active colitis, sigmoid with moderately active colitis, rectum with moderately active colitis   Tyhroglossal duct cyst     Allergies  Allergen Reactions   Doxycycline Other (See Comments)    Possible reaction.  Pt not sure.   No current facility-administered medications on file prior to encounter.   Current Outpatient Medications on File Prior to Encounter  Medication Sig Dispense Refill   acetaminophen (TYLENOL) 500 MG tablet Take 1,000 mg by mouth as needed for moderate pain.      Adalimumab (HUMIRA) 40 MG/0.8ML PSKT INJECT 40 MG UNDER THE SKIN EVERY 14 DAYS 2 each 5   Social History   Socioeconomic History   Marital status: Single    Spouse name: Not on file   Number of children: Not on file   Years of education: Not on file   Highest education level: Not on file  Occupational History   Not on file  Tobacco Use   Smoking status: Former    Types: Cigarettes    Smokeless tobacco: Former    Types: Chew   Tobacco comments:    no cigarettes in 4 years,   Vaping Use   Vaping Use: Never used  Substance and Sexual Activity   Alcohol use: Not Currently    Comment: rare   Drug use: Not Currently    Comment: pain pills 5 years ago, none since then.    Sexual activity: Not on file  Other Topics Concern   Not on file  Social History Narrative   Not on file   Social Determinants of Health   Financial Resource Strain: Not on file  Food Insecurity: Not on file  Transportation Needs: Not on file  Physical Activity: Not on file  Stress: Not on file  Social Connections: Not on file  Intimate Partner Violence: Not on file   Family History  Problem Relation Age of Onset   Cancer Paternal Grandmother    Colon cancer Neg Hx    Colon polyps Neg Hx    Inflammatory bowel disease Neg Hx     OBJECTIVE:  Vitals:   11/03/20 1200  BP: 136/77  Pulse: 76  Resp: 18  Temp: 99 F (37.2 C)  TempSrc: Oral  SpO2: 98%    General appearance: alert, NAD, appears stated age Head: NCAT Throat: lips, mucosa, and tongue normal; teeth and gums normal Lungs: CTA bilaterally without adventitious breath sounds Heart: regular rate  and rhythm.   Back: no CVA tenderness Abdomen: soft, non-tender; bowel sounds normal; no guarding GU: deferred Skin: warm and dry Psychological:  Alert and cooperative. Normal mood and affect.  ASSESSMENT & PLAN:  1. STD exposure     Meds ordered this encounter  Medications   azithromycin (ZITHROMAX) tablet 1,000 mg    Pending: Labs Reviewed  CYTOLOGY, (ORAL, ANAL, URETHRAL) ANCILLARY ONLY    Azithromycin given in office Cytology obtained  Declines HIV/ syphilis testing today We will follow up with you regarding the results of your test If tests are positive, please abstain from sexual activity until you and your partner(s) are treated Follow up with PCP if symptoms persists Return here or go to ER if you have any  new or worsening symptoms fever, chills, nausea, vomiting, redness, swelling, drainage, etc...   Reviewed expectations re: course of current medical issues. Questions answered. Outlined signs and symptoms indicating need for more acute intervention. Patient verbalized understanding. After Visit Summary given.        Lestine Box, PA-C 11/03/20 1218

## 2020-11-03 NOTE — ED Triage Notes (Signed)
Would like to be tested for std's. Was told by former partner they were positive for chlamydia.  Pt denies any s/s.

## 2020-11-04 LAB — CYTOLOGY, (ORAL, ANAL, URETHRAL) ANCILLARY ONLY
Chlamydia: NEGATIVE
Comment: NEGATIVE
Comment: NEGATIVE
Comment: NORMAL
Neisseria Gonorrhea: NEGATIVE
Trichomonas: NEGATIVE

## 2020-11-17 DIAGNOSIS — F4323 Adjustment disorder with mixed anxiety and depressed mood: Secondary | ICD-10-CM | POA: Diagnosis not present

## 2020-12-22 DIAGNOSIS — F4323 Adjustment disorder with mixed anxiety and depressed mood: Secondary | ICD-10-CM | POA: Diagnosis not present

## 2020-12-26 DIAGNOSIS — M25511 Pain in right shoulder: Secondary | ICD-10-CM | POA: Diagnosis not present

## 2020-12-26 DIAGNOSIS — M25512 Pain in left shoulder: Secondary | ICD-10-CM | POA: Diagnosis not present

## 2020-12-26 DIAGNOSIS — S43432D Superior glenoid labrum lesion of left shoulder, subsequent encounter: Secondary | ICD-10-CM | POA: Diagnosis not present

## 2021-01-16 ENCOUNTER — Ambulatory Visit: Payer: BC Managed Care – PPO | Admitting: Gastroenterology

## 2021-01-19 DIAGNOSIS — F4323 Adjustment disorder with mixed anxiety and depressed mood: Secondary | ICD-10-CM | POA: Diagnosis not present

## 2021-02-16 DIAGNOSIS — F4323 Adjustment disorder with mixed anxiety and depressed mood: Secondary | ICD-10-CM | POA: Diagnosis not present

## 2021-02-23 ENCOUNTER — Ambulatory Visit
Admission: EM | Admit: 2021-02-23 | Discharge: 2021-02-23 | Disposition: A | Discharge status: Home / Self Care | Payer: BC Managed Care – PPO | Attending: Family Medicine | Admitting: Family Medicine

## 2021-02-23 ENCOUNTER — Other Ambulatory Visit: Payer: Self-pay

## 2021-02-23 DIAGNOSIS — J069 Acute upper respiratory infection, unspecified: Secondary | ICD-10-CM

## 2021-02-23 MED ORDER — PREDNISONE 20 MG PO TABS: 40.0000 mg | ORAL_TABLET | Freq: Every day | ORAL | 0 refills | Status: DC

## 2021-02-23 NOTE — ED Provider Notes (Signed)
[facility]    CSN: [id number] Arrival date & time: 02/23/21  1638      History   Chief Complaint Chief Complaint  Patient presents with   Sore Throat    Stuffy nose and sore throat    HPI Michael Mcdowell is a 30 y.o. male.   Presenting today with 2-day history of hoarseness, sore throat, nasal congestion, sinus pain and pressure.  Denies fever, chills, body aches, chest pain, shortness of breath, abdominal pain, nausea vomiting or diarrhea.  So far taking over-the-counter cold and flu medications with no relief.  No known sick contacts recently.  No known pertinent chronic medical problems.   Past Medical History:  Diagnosis Date   Substance abuse in remission [facility])    Ulcerative colitis ([place]) 06/2019    Patient Active Problem List   Diagnosis Date Noted   Diarrhea 08/19/2019   Ulcerative colitis ([place]) 07/10/2019   Rectal bleeding 05/20/2019   Colitis 05/20/2019   KNEE PAIN, RIGHT 10/15/2007   TALIPES CAVUS 10/15/2007    Past Surgical History:  Procedure Laterality Date   BIOPSY  06/08/2019   Procedure: BIOPSY;  Surgeon: Daneil Dolin, MD;  [facility];  Service: Endoscopy;;   COLONOSCOPY WITH PROPOFOL N/A 06/08/2019   Inflamed rectum and left colon likely representing UC in evolution. Cecal: midly active colitis, de=scending colon with moderately active colitis, sigmoid with moderately active colitis, rectum with moderately active colitis   Tyhroglossal duct cyst         Home Medications    Prior to Admission medications   Medication Sig Start Date End Date Taking? Authorizing Provider  predniSONE (DELTASONE) 20 MG tablet Take 2 tablets (40 mg total) by mouth daily with breakfast. 02/23/21  Yes Volney American, PA-C  acetaminophen (TYLENOL) 500 MG tablet Take 1,000 mg by mouth as needed for moderate pain.     [provider]  Adalimumab (HUMIRA) 40 MG/0.8ML PSKT INJECT 40 MG UNDER THE SKIN EVERY 71 DAYS 03/10/20   Erenest Rasher, PA-C    Family History Family History  Problem Relation Age of Onset   Healthy Mother    Healthy Father    Cancer Paternal Grandmother    Colon cancer Neg Hx    Colon polyps Neg Hx    Inflammatory bowel disease Neg Hx     Social History Social History   Tobacco Use   Smoking status: Former    Types: Cigarettes   Smokeless tobacco: Former    Types: Chew   Tobacco comments:    no cigarettes in 4 years,   Vaping Use   Vaping Use: Never used  Substance Use Topics   Alcohol use: Not Currently    Comment: rare   Drug use: Not Currently    Comment: pain pills 5 years ago, none since then.      Allergies   Doxycycline   Review of Systems Review of Systems Per HPI  Physical Exam Triage Vital Signs ED Triage Vitals  Enc Vitals Group     BP 02/23/21 1657 132/79     Pulse Rate 02/23/21 1657 91     Resp 02/23/21 1657 20     Temp 02/23/21 1657 99.2 F (37.3 C)     Temp Source 02/23/21 1657 Oral     SpO2 02/23/21 1657 96 %     Weight --      Height --      Head Circumference --  Peak Flow --      Pain Score 02/23/21 1655 4     Pain Loc --      Pain Edu? --      Excl. in [place]? --    No data found.  Updated Vital Signs BP 132/79 (BP Location: Right Arm)    Pulse 91    Temp 99.2 F (37.3 C) (Oral)    Resp 20    SpO2 96%   Visual Acuity Right Eye Distance:   Left Eye Distance:   Bilateral Distance:    Right Eye Near:   Left Eye Near:    Bilateral Near:     Physical Exam Vitals and nursing note reviewed.  Constitutional:      Appearance: Normal appearance. He is well-developed.  HENT:     Head: Atraumatic.     Right Ear: External ear normal.     Left Ear: External ear normal.     Nose: Rhinorrhea present.     Mouth/Throat:     Pharynx: Posterior oropharyngeal erythema present. No oropharyngeal exudate.  Eyes:     Extraocular Movements: Extraocular movements intact.     Conjunctiva/sclera: Conjunctivae normal.     Pupils: Pupils are  equal, round, and reactive to light.  Cardiovascular:     Rate and Rhythm: Normal rate and regular rhythm.  Pulmonary:     Effort: Pulmonary effort is normal. No respiratory distress.     Breath sounds: Normal breath sounds. No wheezing or rales.  Musculoskeletal:        General: Normal range of motion.     Cervical back: Normal range of motion and neck supple.  Lymphadenopathy:     Cervical: No cervical adenopathy.  Skin:    General: Skin is warm and dry.  Neurological:     General: No focal deficit present.     Mental Status: He is alert and oriented to person, place, and time.  Psychiatric:        Mood and Affect: Mood normal.        Behavior: Behavior normal.        Thought Content: Thought content normal.        Judgment: Judgment normal.     UC Treatments / Results  Labs (all labs ordered are listed, but only abnormal results are displayed) Labs Reviewed - No data to display  EKG   Radiology No results found.  Procedures Procedures (including critical care time)  Medications Ordered in UC Medications - No data to display  Initial Impression / Assessment and Plan / UC Course  I have reviewed the triage vital signs and the nursing notes.  Pertinent labs & imaging results that were available during my care of the patient were reviewed by me and considered in my medical decision making (see chart for details).     Suspect viral URI, declines COVID and flu testing today.  We will treat with prednisone, DayQuil, NyQuil, nasal sprays and sinus rinses.  Return for acutely worsening symptoms.  Final Clinical Impressions(s) / UC Diagnoses   Final diagnoses:  Viral URI   Discharge Instructions   None    ED Prescriptions     Medication Sig Dispense Auth. Provider   predniSONE (DELTASONE) 20 MG tablet Take 2 tablets (40 mg total) by mouth daily with breakfast. 10 tablet Volney American, [place]      PDMP not reviewed this encounter.   Volney American, [place] 02/23/21 1932

## 2021-02-23 NOTE — ED Triage Notes (Signed)
Patient states that yesterday evening he had a raspy throat with stopped head and nose.  Patient states that this morning when he got up everything was worse.   Patient states he took an OTC cold and Flu yesterday without any relief  May have been exposed to the Flu.  Denies Fever.  Patient states he does not want to be tested for Covid/Flu

## 2021-03-10 DIAGNOSIS — R197 Diarrhea, unspecified: Secondary | ICD-10-CM | POA: Diagnosis not present

## 2021-04-12 IMAGING — CT CT ABD-PELV W/ CM
2 of 3 series · 16 of 46 positions shown, 18 images · IV contrast (Omnipaque or Isovue)
Comparison: 08/31/2014

CLINICAL DATA: GI bleed. Blood in stool for 2 months with frequent
diarrhea

EXAM:
CT ABDOMEN AND PELVIS WITH CONTRAST
TECHNIQUE: Multidetector CT imaging of the abdomen and pelvis was performed
using the standard protocol following bolus administration of
intravenous contrast.
CONTRAST:  100mL OMNIPAQUE IOHEXOL 300 MG/ML  SOLN

[Series 2: axial st · axial · 0.67mm/px · z∈[+766,+1166]mm · 13 of 92 slices shown, 15 images]
[im 6/92  soft-tissue]
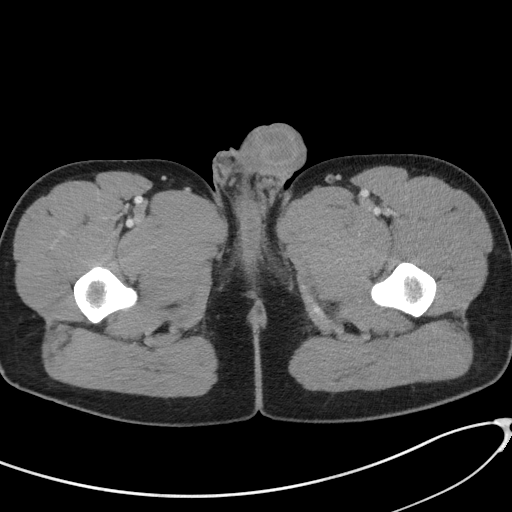
[im 6/92  bone]
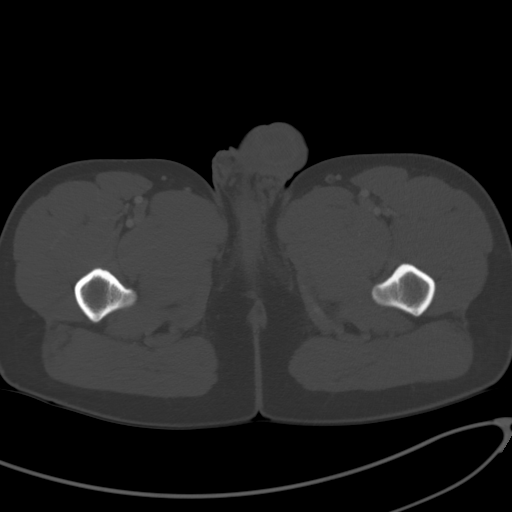
[im 12/92  soft-tissue]
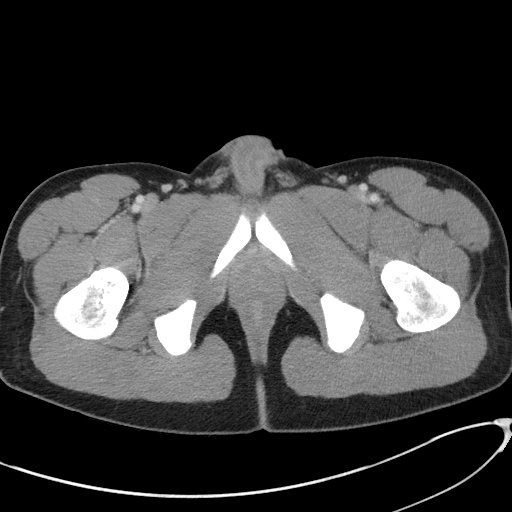
[im 18/92  soft-tissue]
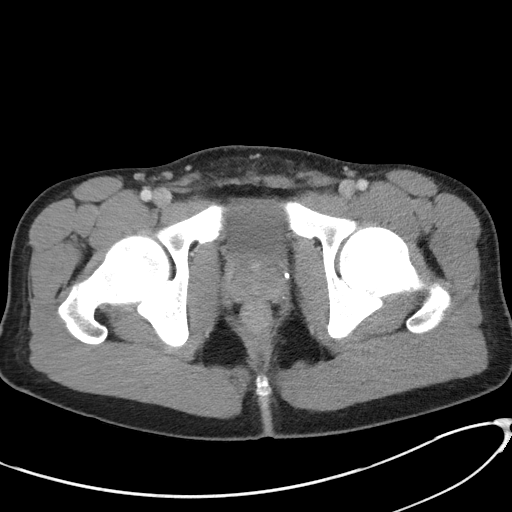
[im 27/92  soft-tissue]
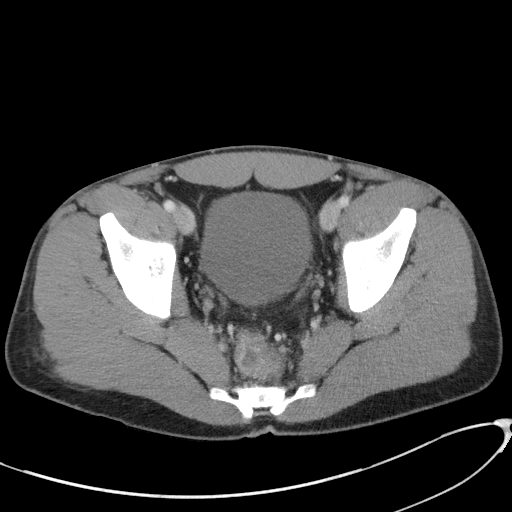
[im 33/92  soft-tissue]
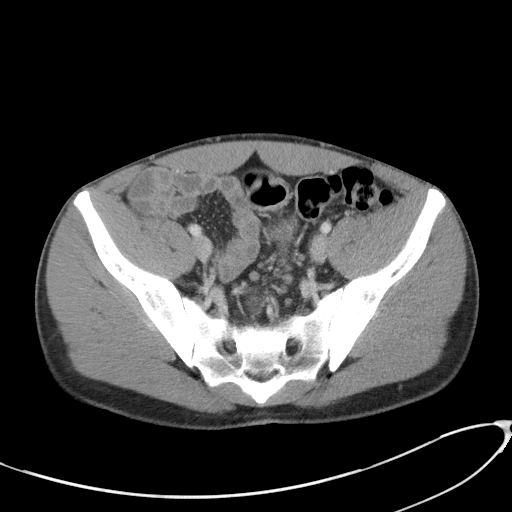
[im 39/92  soft-tissue]
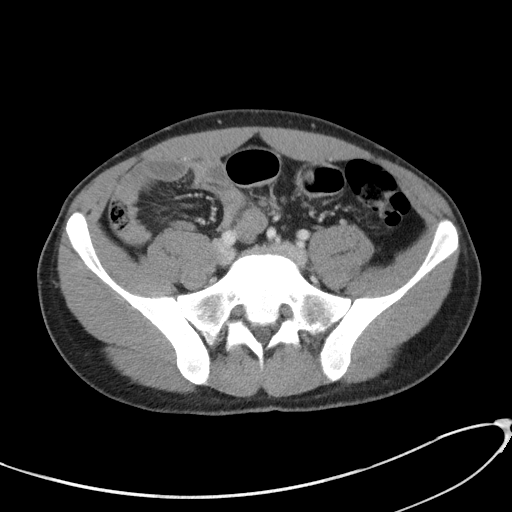
[im 47/92  soft-tissue]
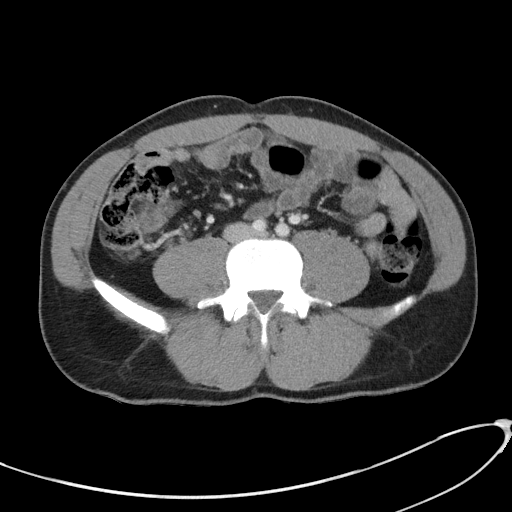
[im 53/92  soft-tissue]
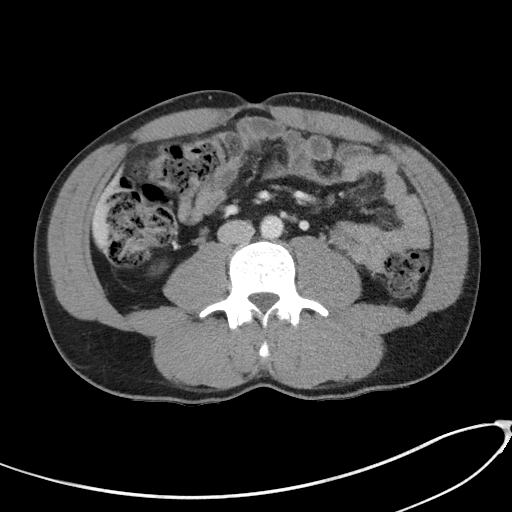
[im 59/92  soft-tissue]
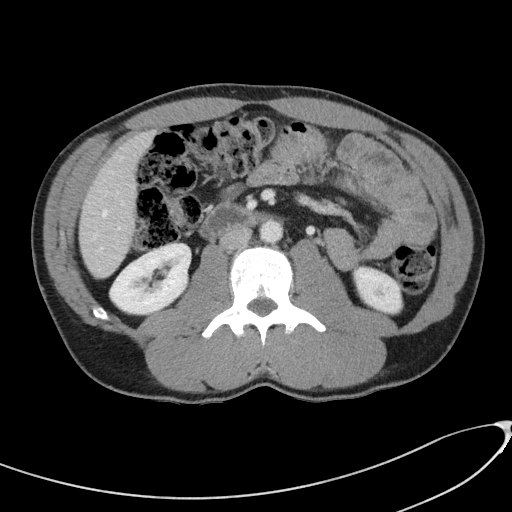
[im 59/92  bone]
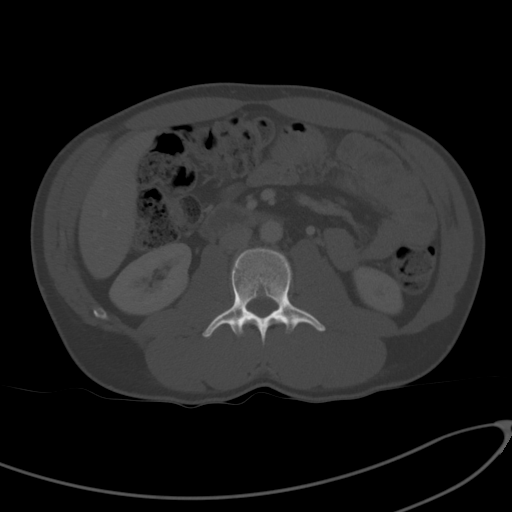
[im 65/92  soft-tissue]
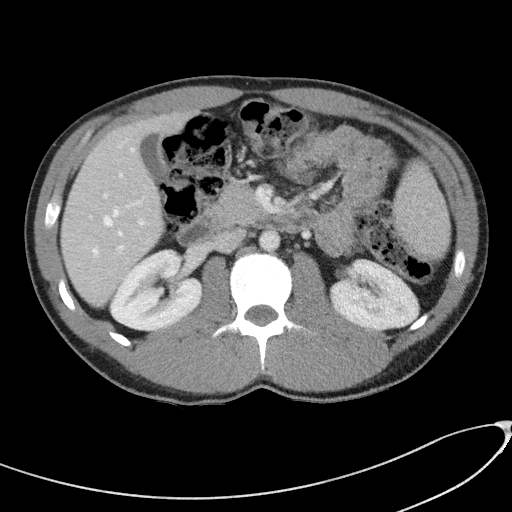
[im 74/92  soft-tissue]
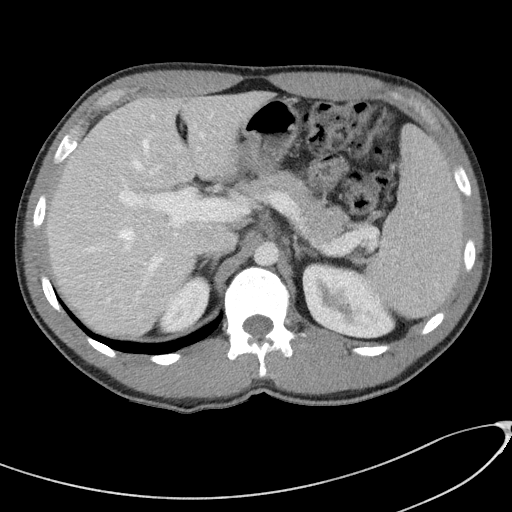
[im 80/92  soft-tissue]
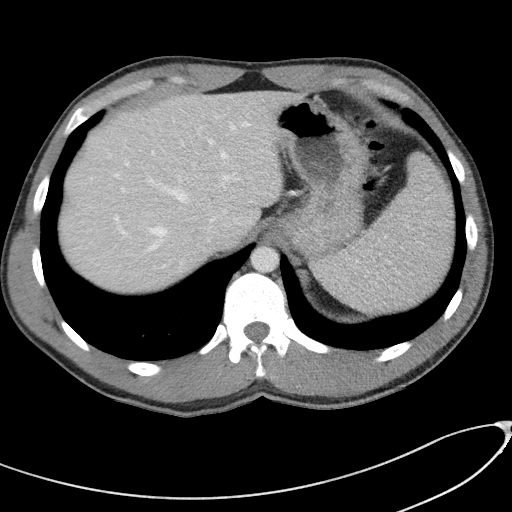
[im 86/92  soft-tissue]
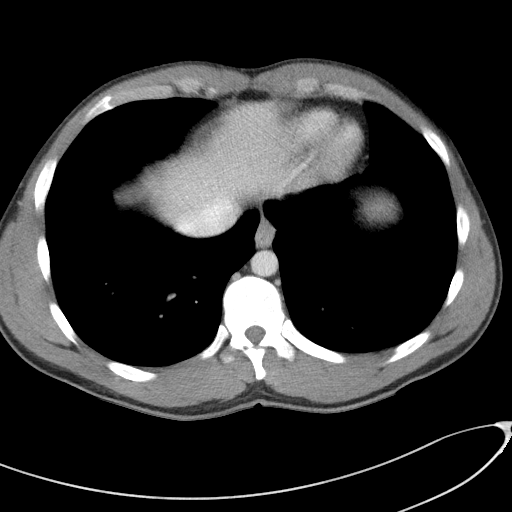

[Series 5: coronal st · coronal · 0.80mm/px · 3 of 84 slices shown]
[im 28/84  soft-tissue]
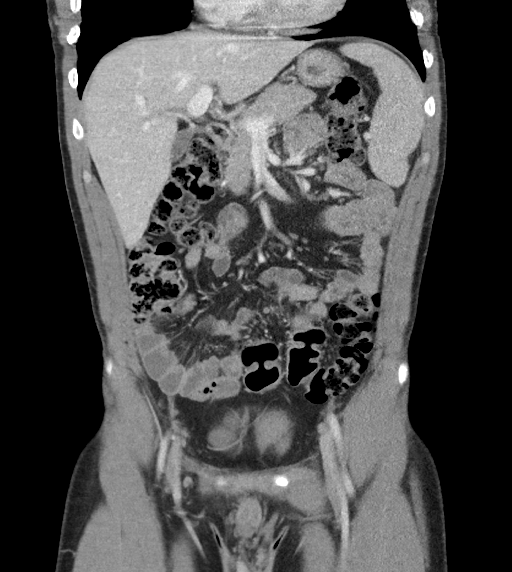
[im 37/84  soft-tissue]
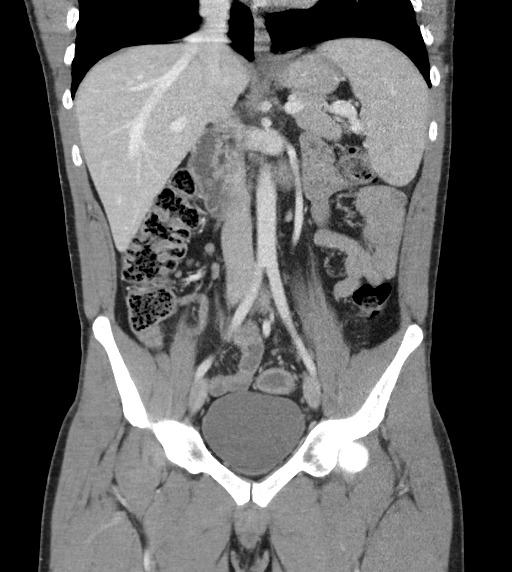
[im 47/84  soft-tissue]
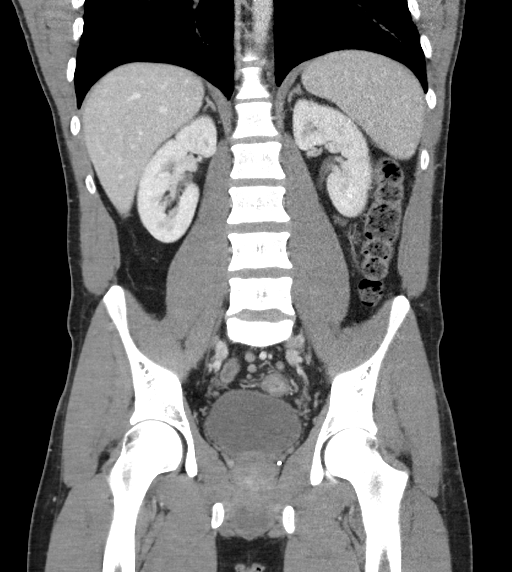

[16 of 46 positions shown; findings below may reference images not displayed]

FINDINGS: Lower chest: The lung bases appear clear. No pleural or pericardial
effusion identified.

Hepatobiliary: No focal liver abnormality is seen. No gallstones,
gallbladder wall thickening, or biliary dilatation.

Pancreas: Unremarkable. No pancreatic ductal dilatation or
surrounding inflammatory changes.

Spleen: Normal in size without focal abnormality.

Adrenals/Urinary Tract: The adrenal glands are normal. The kidneys
are unremarkable. No mass or hydronephrosis identified. The urinary
bladder is within normal limits.

Stomach/Bowel: Stomach is nondistended. The small bowel loops are
unremarkable. No small bowel wall thickening, inflammation or
distension. The appendix is visualized and appears normal.

The proximal and mid colon appear normal. Mucosal enhancement and
mild wall thickening is identified involving the sigmoid colon and
rectum. There is hyperemia of the adjacent Vasa recta and multiple
prominent pericolonic and perirectal lymph nodes, image 63/2 and
image 60/2.

Vascular/Lymphatic: No significant vascular findings are present. No
enlarged abdominal or pelvic lymph nodes.

Reproductive: Prostate is unremarkable.

Other: No free fluid or fluid collections identified.

Musculoskeletal: No acute or significant osseous findings.
IMPRESSION: 1. Examination is positive for mild wall thickening and mucosal
enhancement involving the sigmoid colon and rectum concerning for
inflammatory or infectious colitis. No complications identified.
2. Prominent pericolonic and perirectal lymph nodes are identified
which may be reactive in etiology.
3. The appendix is visualized and appears normal.

## 2021-04-27 DIAGNOSIS — F4323 Adjustment disorder with mixed anxiety and depressed mood: Secondary | ICD-10-CM | POA: Diagnosis not present

## 2021-05-16 ENCOUNTER — Telehealth: Payer: Self-pay | Admitting: Internal Medicine

## 2021-05-16 NOTE — Telephone Encounter (Signed)
Patient called to let us know they are sending in a refill request for his medication,  he is out  ?

## 2021-05-17 ENCOUNTER — Other Ambulatory Visit: Payer: Self-pay | Admitting: Gastroenterology

## 2021-05-17 DIAGNOSIS — K51911 Ulcerative colitis, unspecified with rectal bleeding: Secondary | ICD-10-CM

## 2021-05-17 NOTE — Telephone Encounter (Signed)
Noted  

## 2021-05-17 NOTE — Telephone Encounter (Signed)
Please reach out to the pharmacy and see what is going on with this. It is important that we get his Humira to him ASAP.  ?

## 2021-05-17 NOTE — Progress Notes (Signed)
error 

## 2021-05-17 NOTE — Telephone Encounter (Signed)
Spoke to [place] from [facility]. They took a verbal order for the Humira. And will mail it soon as possible.  ?

## 2021-05-17 NOTE — Telephone Encounter (Signed)
Spoke to pt, he informed me that he was out of Humira. Stated that pharmacy normal calls and lets him know when they will deliver medication. But he has not heard from them.  ?

## 2021-05-17 NOTE — Telephone Encounter (Signed)
Great. Thank you.

## 2021-05-27 NOTE — Progress Notes (Incomplete)
? ? ?Referring Provider: Caryl Bis, MD ?Primary Care Physician:  Caryl Bis, MD ?Primary GI Physician: Dr. Gala Romney ? ?No chief complaint on file. ? ? ?HPI:   ?Michael Mcdowell is a 31 y.o. male presenting today for follow-up of UC, diagnosed April 2021, found to have mild to moderate active colitis in the cecum, descending colon, sigmoid colon, rectum. He failed Entocort, Rowasa enema, and Lialda, ultimately required prednisone and started on Humira in June 2021.  ? ?We have not seen patient since 09/09/2019. He was on a prednisone taper still. Had taken the first 2 starter Humira injections, though the first injection was not entirely successful . He was finally starting to notice improvement in abdominal pain, stool frequently/consistency, and rectal bleeding, though he did continue to have symptoms. Also easily fatigued and weakness. Had lost a total of 29 lbs since March 2021. Planned to update labs, continue prednisone 40 mg x 1 week then start tapering by 10 mg/week, continue Humira. Requested progress report in 2 weeks and follow-up in 4 weeks.  ? ?Labs were not completed.  ? ?Call on 7/26 with patient reporting he was feeling very well. 4-5 Bms daily. Rectal bleeding decreasing and not very often. Pain improving.  ? ?He was tapering off of prednisone in August 2021 and reported he was doing very well. 1 BM daily and nightly, stools more solid, no abdominal pain, minimal rectal bleeding.  ? ?Patient canceled 3 follow-up appointments and no-showed to two appointments. ? ?Today: ? ? ?Colonoscopy 6-12 months after clinical admission: ? ? ?Vaccines:  ?Reported receiving varicella zoster vaccine as a child. ?No immunity to hepatitis B. ?Unknown if immune to hepatitis A. ?Influenza: ?Pneumonia vaccine: ? ?Vitamin D, CRP, Sed rate, CBC, CMP ? ?Past Medical History:  ?Diagnosis Date  ? Substance abuse in remission [facility])   ? Ulcerative colitis ([place]) 06/2019  ? ? ?Past Surgical History:  ?Procedure Laterality  Date  ? BIOPSY  06/08/2019  ? Procedure: BIOPSY;  Surgeon: Daneil Dolin, MD;  [facility];  Service: Endoscopy;;  ? COLONOSCOPY WITH PROPOFOL N/A 06/08/2019  ? Inflamed rectum and left colon likely representing UC in evolution. Cecal: midly active colitis, de=scending colon with moderately active colitis, sigmoid with moderately active colitis, rectum with moderately active colitis  ? Tyhroglossal duct cyst    ? ? ?Current Outpatient Medications  ?Medication Sig Dispense Refill  ? acetaminophen (TYLENOL) 500 MG tablet Take 1,000 mg by mouth as needed for moderate pain.     ? Adalimumab (HUMIRA) 40 MG/0.8ML PSKT INJECT 40 MG UNDER THE SKIN EVERY 14 DAYS 2 each 5  ? predniSONE (DELTASONE) 20 MG tablet Take 2 tablets (40 mg total) by mouth daily with breakfast. 10 tablet 0  ? ?No current facility-administered medications for this visit.  ? ? ?Allergies as of 05/29/2021 - Review Complete 02/23/2021  ?Allergen Reaction Noted  ? Doxycycline Other (See Comments) 08/31/2014  ? ? ?Family History  ?Problem Relation Age of Onset  ? Healthy Mother   ? Healthy Father   ? Cancer Paternal Grandmother   ? Colon cancer Neg Hx   ? Colon polyps Neg Hx   ? Inflammatory bowel disease Neg Hx   ? ? ?Social History  ? ?Socioeconomic History  ? Marital status: Single  ?  Spouse name: Not on file  ? Number of children: Not on file  ? Years of education: Not on file  ? Highest education level: Not on file  ?  Occupational History  ? Not on file  ?Tobacco Use  ? Smoking status: Former  ?  Types: Cigarettes  ? Smokeless tobacco: Former  ?  Types: Chew  ? Tobacco comments:  ?  no cigarettes in 4 years,   ?Vaping Use  ? Vaping Use: Never used  ?Substance and Sexual Activity  ? Alcohol use: Not Currently  ?  Comment: rare  ? Drug use: Not Currently  ?  Comment: pain pills 5 years ago, none since then.   ? Sexual activity: Not Currently  ?Other Topics Concern  ? Not on file  ?Social History Narrative  ? Not on file  ? ?Social Determinants  of Health  ? ?Financial Resource Strain: Not on file  ?Food Insecurity: Not on file  ?Transportation Needs: Not on file  ?Physical Activity: Not on file  ?Stress: Not on file  ?Social Connections: Not on file  ? ? ?Review of Systems: ?Gen: Denies fever, chills, cold or flu from symptoms, presyncope, syncope. ?CV: Denies chest pain, palpitations. ?Resp: Denies dyspnea or cough. ?GI: See HPI ?Heme: See HPI ? ?Physical Exam: ?There were no vitals taken for this visit. ?General:   Alert and oriented. No distress noted. Pleasant and cooperative.  ?Head:  Normocephalic and atraumatic. ?Eyes:  Conjuctiva clear without scleral icterus. ?Heart:  S1, S2 present without murmurs appreciated. ?Lungs:  Clear to auscultation bilaterally. No wheezes, rales, or rhonchi. No distress.  ?Abdomen:  +BS, soft, non-tender and non-distended. No rebound or guarding. No HSM or masses noted. ?Msk:  Symmetrical without gross deformities. Normal posture. ?Extremities:  Without edema. ?Neurologic:  Alert and  oriented x4 ?Psych:  Normal mood and affect. ? ? ? ?Assessment:  ?31 year old male presenting today for follow-up of ulcerative colitis, diagnosed April 2021.  He was found to have mild to moderate active colitis in the cecum, descending colon, sigmoid colon, rectum.  He failed Entocort, release enema, and Lialda, ultimately required prednisone and started on Humira in June 2021. ? ? ?Plan:  ?*** ? ? ?Aliene Altes, PA-C ?[facility] ?05/29/2021  ?

## 2021-05-29 ENCOUNTER — Ambulatory Visit: Payer: BC Managed Care – PPO | Admitting: Gastroenterology

## 2021-05-31 NOTE — Progress Notes (Signed)
? ? ?Primary Care Physician:  Caryl Bis, MD  ?Primary GI: Dr. Gala Romney ? ?Patient Location: Home ?  ?Provider Location: [place] office ?  ?Reason for Visit: Follow-up UC ?  ?Persons present on the virtual encounter, with roles: Aliene Altes, PA-C (Provider), [place] Lore (patient). ? ?  ?Total time (minutes) spent on medical discussion: 11 minutes ? ?Virtual Visit via video Note ?Due to COVID-19, visit is conducted virtually and was requested by patient.  ? ?I connected with Michael Mcdowell on 06/01/21 at  1:00 PM EDT by video and verified that I am speaking with the correct person using two identifiers. ?  ?I discussed the limitations, risks, security and privacy concerns of performing an evaluation and management service by video and the availability of in person appointments. I also discussed with the patient that there may be a patient responsible charge related to this service. The patient expressed understanding and agreed to proceed. ? ?Chief Complaint  ?Patient presents with  ? Follow-up  ?  Medication change   ? ? ? ?History of Present Illness: ?Michael Mcdowell is a 31 y.o. male presenting today for follow-up of UC, diagnosed April 2021, found to have mild to moderate active colitis in the cecum, descending colon, sigmoid colon, rectum. He failed Entocort, Rowasa enema, and Lialda, ultimately required prednisone and started on Humira in June 2021.  ?  ?We have not seen patient since 09/09/2019. He was on a prednisone taper still. Had taken the first 2 starter Humira injections, though the first injection was not entirely successful . He was finally starting to notice improvement in abdominal pain, stool frequently/consistency, and rectal bleeding, though he did continue to have symptoms. Also easily fatigued and weakness. Had lost a total of 29 lbs since March 2021. Planned to update labs, continue prednisone 40 mg x 1 week then start tapering by 10 mg/week, continue Humira. Requested progress report in 2  weeks and follow-up in 4 weeks.  ?  ?Labs were not completed.  ?  ?Call on 7/26 with patient reporting he was feeling very well. 4-5 Bms daily. Rectal bleeding decreasing and not very often. Pain improving.  ?  ?He was tapering off of prednisone in August 2021 and reported he was doing very well. 1 BM daily and nightly, stools more solid, no abdominal pain, minimal rectal bleeding.  ?  ?Patient canceled 3 follow-up appointments and no-showed to two appointments. ?  ?Today: ?No abdominal pain. Irregular bowel movements. # of Bms range from 3-4 per day to 2 days without a BM. Stools are very mushy to watery. No specific food triggers. If a lot of caffeine, will have more diarrhea. No issues with dairy that he knows of. Eats dairy most days. Usually 1-2 cups of yogurt, almond milk. No brbpr or melena. Weight is stable. Good energy.  ? ?Went without Humira for about 1 month, just started back last week. However, states he had the same symptoms prior to running out of Humira.  ? ?Stated he doesn't like getting vaccines. Hans't had influenza vaccine this year.  ? ?No other GI concerns such as nausea, vomiting, reflux symptoms, or dysphagia. ? ? ?Colonoscopy 6-12 months after clinical admission: ?  ?Vaccines:  ?Reported receiving varicella zoster vaccine as a child. ?Shingrix-  ?No immunity to hepatitis B. ?Unknown if immune to hepatitis A. ?Influenza: None.  ?Pneumonia vaccine:  ?  ?Vitamin D, CRP, Sed rate, CBC, CMP, Hep A Ab total ? ?Past Medical History:  ?Diagnosis  Date  ? Substance abuse in remission [facility])   ? Ulcerative colitis ([place]) 06/2019  ? ? ? ?Past Surgical History:  ?Procedure Laterality Date  ? BIOPSY  06/08/2019  ? Procedure: BIOPSY;  Surgeon: Daneil Dolin, MD;  [facility];  Service: Endoscopy;;  ? COLONOSCOPY WITH PROPOFOL N/A 06/08/2019  ? Inflamed rectum and left colon likely representing UC in evolution. Cecal: midly active colitis, de=scending colon with moderately active colitis, sigmoid  with moderately active colitis, rectum with moderately active colitis  ? Tyhroglossal duct cyst    ? ? ? ?Current Meds  ?Medication Sig  ? acetaminophen (TYLENOL) 500 MG tablet Take 1,000 mg by mouth as needed for moderate pain.   ? Adalimumab (HUMIRA) 40 MG/0.8ML PSKT INJECT 40 MG UNDER THE SKIN EVERY 14 DAYS  ?  ? ?Family History  ?Problem Relation Age of Onset  ? Healthy Mother   ? Healthy Father   ? Cancer Paternal Grandmother   ? Colon cancer Neg Hx   ? Colon polyps Neg Hx   ? Inflammatory bowel disease Neg Hx   ? ? ?Social History  ? ?Socioeconomic History  ? Marital status: Single  ?  Spouse name: Not on file  ? Number of children: Not on file  ? Years of education: Not on file  ? Highest education level: Not on file  ?Occupational History  ? Not on file  ?Tobacco Use  ? Smoking status: Former  ?  Types: Cigarettes  ? Smokeless tobacco: Former  ?  Types: Chew  ? Tobacco comments:  ?  no cigarettes in 4 years,   ?Vaping Use  ? Vaping Use: Never used  ?Substance and Sexual Activity  ? Alcohol use: Not Currently  ?  Comment: rare  ? Drug use: Not Currently  ?  Comment: pain pills 5 years ago, none since then.   ? Sexual activity: Not Currently  ?Other Topics Concern  ? Not on file  ?Social History Narrative  ? Not on file  ? ?Social Determinants of Health  ? ?Financial Resource Strain: Not on file  ?Food Insecurity: Not on file  ?Transportation Needs: Not on file  ?Physical Activity: Not on file  ?Stress: Not on file  ?Social Connections: Not on file  ? ? ? ? ? Review of Systems: ?Gen: Denies fever, chills, cold or flu like symptoms, pre-syncope, or syncope.  ?CV: Denies chest pain, palpitations. ?Resp: Denies dyspnea or cough.  ?GI: see HPI ?Derm: Denies rash ?Heme: See HPI ? ?Observations/Objective: ?No distress. Alert and oriented. Pleasant. Well nourished. Normal mood and affect. Unable to perform complete physical exam due to video encounter.  ? ? ?Assessment:  ?31 year old male presenting today for  follow-up of UC, diagnosed April 2021, found to have mild to moderate active colitis in the cecum, descending colon, sigmoid colon, rectum.  Failed Entocort, Rowasa enema, Lialda, ultimately required prednisone and started on Humira in June 2021.  Currently taking 40 mg every 14 days though he did run out of Humira for 1 month and just resumed Humira last week.  Clinically, he is doing fairly well.  Denies abdominal pain, BRBPR, melena, unintentional weight loss.  He does continue with irregular bowel movements with 3-4 mushy to watery bowel movements most days, but can occasionally skip a couple of days without bowel movements.  Denies any specific food trigger.  Eats dairy daily.  Though he ran out of Humira 1 month ago, he reports he is having the same  issues with his bowels prior and he had not missed any Humira doses.  ? ?Query whether patient has an inadequate response to Humira versus medication not being at therapeutic level.  As he just resumed Humira last week, we will need to hold off on checking trough and antibody levels for couple of months.  However, he is well overdue for routine labs which we will arrange.  We will also check CRP and sed rate and screen for celiac disease. I will also check a 1 time vitamin D level and screen for Hep A immunity. ? ?We did discuss the importance of vaccinations as he is immunocompromised.  Patient states he does not like to receive vaccines.  I encouraged him to move forward with the recommended vaccines. As visit was via video, we will mail a list of the recommended vaccines, and he can discuss with his PCP.  ? ? ?Plan: ?CBC, CMP, sed rate, CRP, IgA, TTG IgA, hepatitis A antibody total, vitamin D. ?Continue Humira 40 mg every 14 days. ?Recommended yearly influenza vaccine, pneumonia vaccine (PCV 20), hepatitis B vaccine, shingles vaccine (Shingrix only). Patient is to discuss with PCP. Advised that he cannot have any live vaccines in the setting of  Humira. ?Follow-up in 3 months.  We will check Humira trough and antibody levels at that time. ? ? ?I discussed the assessment and treatment plan with the patient. The patient was provided an opportunity to ask questions and

## 2021-06-01 ENCOUNTER — Encounter: Payer: Self-pay | Admitting: Gastroenterology

## 2021-06-01 ENCOUNTER — Telehealth: Payer: Self-pay | Admitting: *Deleted

## 2021-06-01 ENCOUNTER — Telehealth (INDEPENDENT_AMBULATORY_CARE_PROVIDER_SITE_OTHER): Payer: BC Managed Care – PPO | Admitting: Gastroenterology

## 2021-06-01 VITALS — Ht 68.0 in | Wt 155.0 lb

## 2021-06-01 DIAGNOSIS — R197 Diarrhea, unspecified: Secondary | ICD-10-CM | POA: Diagnosis not present

## 2021-06-01 DIAGNOSIS — K51911 Ulcerative colitis, unspecified with rectal bleeding: Secondary | ICD-10-CM | POA: Diagnosis not present

## 2021-06-01 NOTE — Telephone Encounter (Signed)
Michael Mcdowell [place], you are scheduled for a virtual visit with your provider today.  Just as we do with appointments in the office, we must obtain your consent to participate.  Your consent will be active for this visit and any virtual visit you may have with one of our providers in the next 365 days.  If you have a MyChart account, I can also send a copy of this consent to you electronically.  All virtual visits are billed to your insurance company just like a traditional visit in the office.  As this is a virtual visit, video technology does not allow for your provider to perform a traditional examination.  This may limit your provider's ability to fully assess your condition.  If your provider identifies any concerns that need to be evaluated in person or the need to arrange testing such as labs, EKG, etc, we will make arrangements to do so.  Although advances in technology are sophisticated, we cannot ensure that it will always work on either your end or our end.  If the connection with a video visit is poor, we may have to switch to a telephone visit.  With either a video or telephone visit, we are not always able to ensure that we have a secure connection.   I need to obtain your verbal consent now.   Are you willing to proceed with your visit today?  ?

## 2021-06-01 NOTE — Patient Instructions (Addendum)
Please have blood work completed at [facility] ? ?Continue taking Humira 40 mg every 14 days.  We will plan to check your trough and antibody levels in 3 months. ? ?The following vaccines are recommended for you as you are immunocompromised (Please discuss with your primary care doctor and let me know if they are not able to provide them for you): ?*Please note, you may not take any live vaccines due to Humira. ?Yearly influenza vaccine ?Pneumonia vaccine (PCV 20) ?Hepatitis B ?Shingles vaccine (Shingrix only) ? ?We will need to see you back in 3 months to see how you are doing and will arrange to check your Humira levels at that time.  ? ?It was good to see you again today!  ? ?Aliene Altes, PA-C ?[facility] ? ?

## 2021-06-01 NOTE — Telephone Encounter (Signed)
Pt consented to a virtual visit. 

## 2021-07-20 ENCOUNTER — Encounter: Payer: Self-pay | Admitting: Internal Medicine

## 2021-07-25 ENCOUNTER — Telehealth: Payer: Self-pay | Admitting: *Deleted

## 2021-07-25 NOTE — Telephone Encounter (Signed)
Spoke to pt, he was inquiring about having labs done. States that when he had virtual visit, you stated he needed to have labs done. He states he is having more diarrhea.

## 2021-07-25 NOTE — Telephone Encounter (Signed)
He was supposed to have CBC, CMP, sed rate, CRP, IgA, TTG IgA, hepatitis A antibody total, vitamin D. Orders were placed at the time of his visit. Not sure if the lab will still have on file. Please see if we need to place new orders and update patient.   Also, how many BMs per day? Previously was having 3-4 Bms per day, but skipping days at times. Any nocturnal stools, brbpr, melena? Has he missed any more doses of Humira?

## 2021-07-26 NOTE — Telephone Encounter (Signed)
Yes, we need labs ASAP. Suspect symptoms may be secondary to uncontrolled UC. Once I have labs back, I will have additional recommendations. Please ask when his next dose of Humira is due. We may need to go ahead and check antibody and trough levels to Humira.

## 2021-07-26 NOTE — Telephone Encounter (Signed)
Spoke to pt, he informed me that he is having squirts of loose stool sometimes and sometimes its blood. He states he has not missed any doses of Humira, no nocturnal stools, brbpr, melena. Informed him to go have labs drawn as soon as possible.

## 2021-07-26 NOTE — Telephone Encounter (Signed)
OK, so lets have him go ahead and complete the labs already ordered so we can see if his inflammatory markers are elevated and what his hemoglobin is now.   On May 29th, no more than 24 hours prior to his next dose of Humira, or morning of May 30th, prior to injecting his next dose of Humira, we need to check Humira levels and antibody levels. This can not be checked more than 24 hours prior to his next dose of Humira in order to have an accurate result.   Courtney: Please arrange "Adalimumab drug level and antidrug AB for IBD". Dx: Ulcerative Colitis.

## 2021-07-26 NOTE — Telephone Encounter (Signed)
Spoke to pt, he informed me that his next dose of Humira is due on May 30. Informed to have labs done as soon as possible.

## 2021-07-27 ENCOUNTER — Other Ambulatory Visit: Payer: Self-pay | Admitting: *Deleted

## 2021-07-27 ENCOUNTER — Other Ambulatory Visit: Payer: Self-pay | Admitting: Gastroenterology

## 2021-07-27 DIAGNOSIS — K51911 Ulcerative colitis, unspecified with rectal bleeding: Secondary | ICD-10-CM

## 2021-07-27 DIAGNOSIS — E559 Vitamin D deficiency, unspecified: Secondary | ICD-10-CM | POA: Diagnosis not present

## 2021-07-27 DIAGNOSIS — R197 Diarrhea, unspecified: Secondary | ICD-10-CM | POA: Diagnosis not present

## 2021-07-27 NOTE — Telephone Encounter (Signed)
Spoke to pt, he informed me that he had labs drawn this morning.

## 2021-07-27 NOTE — Progress Notes (Signed)
error 

## 2021-07-28 ENCOUNTER — Other Ambulatory Visit: Payer: Self-pay | Admitting: Gastroenterology

## 2021-07-28 DIAGNOSIS — K51911 Ulcerative colitis, unspecified with rectal bleeding: Secondary | ICD-10-CM

## 2021-07-28 LAB — CBC WITH DIFFERENTIAL/PLATELET
Absolute Monocytes: 450 cells/uL (ref 200–950)
Basophils Absolute: 78 cells/uL (ref 0–200)
Basophils Relative: 1.3 %
Eosinophils Absolute: 654 cells/uL — ABNORMAL HIGH (ref 15–500)
Eosinophils Relative: 10.9 %
HCT: 47.3 % (ref 38.5–50.0)
Hemoglobin: 15.9 g/dL (ref 13.2–17.1)
Lymphs Abs: 1548 cells/uL (ref 850–3900)
MCH: 31.1 pg (ref 27.0–33.0)
MCHC: 33.6 g/dL (ref 32.0–36.0)
MCV: 92.6 fL (ref 80.0–100.0)
MPV: 10.6 fL (ref 7.5–12.5)
Monocytes Relative: 7.5 %
Neutro Abs: 3270 cells/uL (ref 1500–7800)
Neutrophils Relative %: 54.5 %
Platelets: 300 10*3/uL (ref 140–400)
RBC: 5.11 10*6/uL (ref 4.20–5.80)
RDW: 12.5 % (ref 11.0–15.0)
Total Lymphocyte: 25.8 %
WBC: 6 10*3/uL (ref 3.8–10.8)

## 2021-07-28 LAB — COMPLETE METABOLIC PANEL WITH GFR
AG Ratio: 1.5 (calc) (ref 1.0–2.5)
ALT: 22 U/L (ref 9–46)
AST: 24 U/L (ref 10–40)
Albumin: 4.3 g/dL (ref 3.6–5.1)
Alkaline phosphatase (APISO): 40 U/L (ref 36–130)
BUN: 13 mg/dL (ref 7–25)
CO2: 26 mmol/L (ref 20–32)
Calcium: 9.4 mg/dL (ref 8.6–10.3)
Chloride: 104 mmol/L (ref 98–110)
Creat: 0.83 mg/dL (ref 0.60–1.26)
Globulin: 2.8 g/dL (calc) (ref 1.9–3.7)
Glucose, Bld: 53 mg/dL — ABNORMAL LOW (ref 65–139)
Potassium: 4.6 mmol/L (ref 3.5–5.3)
Sodium: 140 mmol/L (ref 135–146)
Total Bilirubin: 0.4 mg/dL (ref 0.2–1.2)
Total Protein: 7.1 g/dL (ref 6.1–8.1)
eGFR: 121 mL/min/{1.73_m2} (ref >=60)

## 2021-07-28 LAB — C-REACTIVE PROTEIN: CRP: 3.9 mg/L (ref <8.0)

## 2021-07-28 LAB — VITAMIN D 25 HYDROXY (VIT D DEFICIENCY, FRACTURES): Vit D, 25-Hydroxy: 44 ng/mL (ref 30–100)

## 2021-07-28 LAB — HEPATITIS A ANTIBODY, TOTAL: Hepatitis A AB,Total: NONREACTIVE

## 2021-07-28 LAB — TISSUE TRANSGLUTAMINASE, IGA: (tTG) Ab, IgA: <1 U/mL

## 2021-07-28 LAB — SEDIMENTATION RATE: Sed Rate: 2 mm/h (ref 0–15)

## 2021-07-28 LAB — IGA: Immunoglobulin A: 238 mg/dL (ref 47–310)

## 2021-07-28 MED ORDER — MESALAMINE 1000 MG RE SUPP: 1000.0000 mg | Freq: Every day | RECTAL | 3 refills | Status: DC

## 2021-08-28 ENCOUNTER — Ambulatory Visit
Admission: EM | Admit: 2021-08-28 | Discharge: 2021-08-28 | Disposition: A | Discharge status: Home / Self Care | Payer: BC Managed Care – PPO | Attending: Family Medicine | Admitting: Family Medicine

## 2021-08-28 DIAGNOSIS — L0291 Cutaneous abscess, unspecified: Secondary | ICD-10-CM

## 2021-08-28 MED ORDER — CEPHALEXIN 500 MG PO CAPS
500.0000 mg | ORAL_CAPSULE | Freq: Two times a day (BID) | ORAL | 0 refills | Status: DC
Start: 2021-08-28 — End: 2021-09-14

## 2021-09-06 ENCOUNTER — Telehealth: Payer: Self-pay | Admitting: *Deleted

## 2021-09-06 NOTE — Telephone Encounter (Signed)
Noted. Please remind him about having blood test completed at [facility] to check his drug levels and antibody levels no more than 24 hours prior to his next dose of Humira.

## 2021-09-06 NOTE — Telephone Encounter (Signed)
Spoke to pt, informed him to have labs completed before he starts next dose of Humira. Pt voiced understanding.

## 2021-09-06 NOTE — Telephone Encounter (Signed)
Spoke to pt, he informed me that his Humira will be delivered on 09/08/2021. Informed to please call office and let me know he received it.

## 2021-09-08 DIAGNOSIS — K51911 Ulcerative colitis, unspecified with rectal bleeding: Secondary | ICD-10-CM | POA: Diagnosis not present

## 2021-09-12 ENCOUNTER — Telehealth: Payer: Self-pay | Admitting: *Deleted

## 2021-09-12 ENCOUNTER — Other Ambulatory Visit: Payer: Self-pay | Admitting: Gastroenterology

## 2021-09-12 DIAGNOSIS — R197 Diarrhea, unspecified: Secondary | ICD-10-CM

## 2021-09-12 DIAGNOSIS — K625 Hemorrhage of anus and rectum: Secondary | ICD-10-CM

## 2021-09-12 DIAGNOSIS — K51911 Ulcerative colitis, unspecified with rectal bleeding: Secondary | ICD-10-CM

## 2021-09-12 NOTE — Telephone Encounter (Signed)
Spoke with patient briefly. Reports having multiple episodes of rectal bleeding a day.  Up to 10/day, 4:56 at night.  Reports blood can be in the toilet water or on toilet tissue, just depends.  Reports he is only having about 1 stool per day, otherwise passing blood only.  He is also having some accidents at times.  Feels somewhat weak, but still working.  Symptoms have been worsening over the last few weeks, but unable to give me any specific time.  States his symptoms fluctuate all over the place.  No significant abdominal pain.  Somewhat similar to prior UC flares, but not as severe as when first diagnosed.  He has not missed his Humira doses.  Tried using mesalamine suppositories, but cannot retain them.  Per chart review, he did complete a course of Keflex starting on 6/26.  Suspect we are dealing with UC flare, but needs to rule out infectious etiology. He needs to be seen in person for evaluation. Discussed ER evaluation as he is feeling weak and having significant bleeding, but he does not want to go to the emergency room for evaluation.  Also states he is out of town tomorrow and cannot come to the office.  Stated he can come on Thursday. Advised that I will place orders for labs and stool studies and will see him on Thursday at [facility] to proceed to the ED if any worsening symptoms.   Lab orders including CBC, BMP, CRP, ESR, C diff GI pathogen panel placed.   Stacey: Please schedule patient to see me at 1pm on Thursday 7/12.

## 2021-09-12 NOTE — Telephone Encounter (Signed)
Spoke to pt, he informed me that he is having several accidents a day. He states losing weight and no energy. Also, is having blood in toilet every time he goes.

## 2021-09-13 ENCOUNTER — Other Ambulatory Visit (HOSPITAL_COMMUNITY)
Admission: RE | Admit: 2021-09-13 | Discharge: 2021-09-13 | Disposition: A | Discharge status: Home / Self Care | Payer: BC Managed Care – PPO | Source: Ambulatory Visit | Attending: Gastroenterology | Admitting: Gastroenterology

## 2021-09-13 ENCOUNTER — Telehealth: Payer: BC Managed Care – PPO | Admitting: Gastroenterology

## 2021-09-13 DIAGNOSIS — R197 Diarrhea, unspecified: Secondary | ICD-10-CM | POA: Diagnosis not present

## 2021-09-13 DIAGNOSIS — K625 Hemorrhage of anus and rectum: Secondary | ICD-10-CM | POA: Insufficient documentation

## 2021-09-13 DIAGNOSIS — K51911 Ulcerative colitis, unspecified with rectal bleeding: Secondary | ICD-10-CM | POA: Insufficient documentation

## 2021-09-13 LAB — CBC WITH DIFFERENTIAL/PLATELET
Abs Immature Granulocytes: 0.03 10*3/uL (ref 0.00–0.07)
Basophils Absolute: 0.1 10*3/uL (ref 0.0–0.1)
Basophils Relative: 1 %
Eosinophils Absolute: 0.3 10*3/uL (ref 0.0–0.5)
Eosinophils Relative: 3 %
HCT: 39.9 % (ref 39.0–52.0)
Hemoglobin: 13.4 g/dL (ref 13.0–17.0)
Immature Granulocytes: 0 %
Lymphocytes Relative: 18 %
Lymphs Abs: 1.7 10*3/uL (ref 0.7–4.0)
MCH: 30.2 pg (ref 26.0–34.0)
MCHC: 33.6 g/dL (ref 30.0–36.0)
MCV: 90.1 fL (ref 80.0–100.0)
Monocytes Absolute: 0.9 10*3/uL (ref 0.1–1.0)
Monocytes Relative: 10 %
Neutro Abs: 6.4 10*3/uL (ref 1.7–7.7)
Neutrophils Relative %: 68 %
Platelets: 301 10*3/uL (ref 150–400)
RBC: 4.43 MIL/uL (ref 4.22–5.81)
RDW: 11.9 % (ref 11.5–15.5)
WBC: 9.4 10*3/uL (ref 4.0–10.5)
nRBC: 0 % (ref 0.0–0.2)

## 2021-09-13 LAB — BASIC METABOLIC PANEL
Anion gap: 10 (ref 5–15)
BUN: 13 mg/dL (ref 6–20)
CO2: 26 mmol/L (ref 22–32)
Calcium: 8.9 mg/dL (ref 8.9–10.3)
Chloride: 101 mmol/L (ref 98–111)
Creatinine, Ser: 1.06 mg/dL (ref 0.61–1.24)
GFR, Estimated: >60 mL/min (ref >60)
Glucose, Bld: 96 mg/dL (ref 70–99)
Potassium: 3.9 mmol/L (ref 3.5–5.1)
Sodium: 137 mmol/L (ref 135–145)

## 2021-09-13 LAB — SEDIMENTATION RATE: Sed Rate: 47 mm/hr — ABNORMAL HIGH (ref 0–16)

## 2021-09-13 NOTE — Progress Notes (Incomplete)
Referring Provider: Caryl Bis, MD Primary Care Physician:  Caryl Bis, MD Primary GI Physician: Dr. Gala Romney  No chief complaint on file.   HPI:   Michael Mcdowell is a 31 y.o. male presenting today for an acute visit with chief complaint of rectal bleeding and diarrhea.   He has history of UC, diagnosed April 2021, found to have mild to moderate active colitis in the cecum, descending colon, sigmoid colon, rectum. He failed Entocort, Rowasa enema, and Lialda, ultimately required prednisone and started on Humira in June 2021.   Last seen via virtual visit 06/01/2021.  Reported irregular bowel movements ranging from 3-4 BMs per day to a couple days without a bowel movement.  Stools mushy to watery.  Had gone without Humira for about 1 month and had just started back the week prior to his office visit.  However, states he had the same symptoms prior to running out of Humira.  He had no other GI concerns such as nausea, vomiting, reflux symptoms, dysphagia, weight loss.  He needed vaccines including hepatitis B, influenza, pneumonia, shingles, but patient stated he was not getting vaccinated.  Overall, queried whether patient was having an adequate response to Humira versus levels being subtherapeutic.  As he just restarted Humira, we would need to hold off on checking trough antibody levels for couple of months.  Plan to update routine labs and screen for celiac disease.  Recommended 87-monthfollow-up.  Labs were not completed until 07/27/2021.  Hemoglobin within normal limits.  Kidney function, electrolytes, LFTs within normal limits.  CRP and sed rate also normal.  Celiac screen negative.  No immunity to hepatitis A.  Vitamin D within normal limits. At that time, patient reported feeling more constipated than anything having 1-2 bowel movements per week, but still with loose consistency, but overall felt bloated and constipated.  Noted occasional toilet tissue hematochezia when passing gas,  but no significant bleeding with bowel movements.  Denies rectal pain.  He was eating a lot of fiber and protein.  Recommended using Colace to help with constipation and trying mesalamine and suppository to help with occasional rectal bleeding.  Requested he call me the following week with a progress report.  Also recommended checking antibody and trough levels in 2 weeks, before his next dose of Humira.  No progress report received.  Patient called 09/12/2021 reporting multiple episodes of rectal bleeding daily, increased diarrhea, some incontinence, mild weakness, but still working.  Symptoms had been worsening over the last few weeks, but unable to give specific details.  Similar to prior UC flares, but not as severe.  No significant abdominal pain.  Had not missed any Humira doses.  Unable to retain mesalamine suppositories. Recently completed course of Keflex for skin abscess. Recommended for patient to have in person office visit ASAP and update labs.  Scheduled to be seen on 7/13 as he was available on 7/12.   *** Having up to 10 episodes of rectal bleeding/day and 4-5 episodes of bleeding at night. Can be a fairy significant amount of blood in the toilet water or just pas gas and have blood on toilet tissue. Some urgency that has led to incontinence.   Stool  Abdominal pain:   Weakness:  Symptoms were worsening prior to recent antibiotics. Unable to give a clear time line on when symptoms started to worse stating, "My symptoms fluctuate so much it is hard for me to tell you."   He did have labs drawn  on 7/7 to check Humira levels/antibodies. This is in process.   NSAIDs:   ?infectious, UC flare, CMV  Past Medical History:  Diagnosis Date   Substance abuse in remission ([place])    Ulcerative colitis ([place]) 06/2019    Past Surgical History:  Procedure Laterality Date   BIOPSY  06/08/2019   Procedure: BIOPSY;  Surgeon: Daneil Dolin, MD;  Location: [facility];  Service: Endoscopy;;    COLONOSCOPY WITH PROPOFOL N/A 06/08/2019   Inflamed rectum and left colon likely representing UC in evolution. Cecal: midly active colitis, de=scending colon with moderately active colitis, sigmoid with moderately active colitis, rectum with moderately active colitis   Tyhroglossal duct cyst      Current Outpatient Medications  Medication Sig Dispense Refill   acetaminophen (TYLENOL) 500 MG tablet Take 1,000 mg by mouth as needed for moderate pain.      Adalimumab (HUMIRA) 40 MG/0.8ML PSKT INJECT 40 MG UNDER THE SKIN EVERY 14 DAYS 2 each 5   cephALEXin (KEFLEX) 500 MG capsule Take 1 capsule (500 mg total) by mouth 2 (two) times daily. 14 capsule 0   mesalamine (CANASA) 1000 MG suppository Place 1 suppository (1,000 mg total) rectally at bedtime. 30 suppository 3   No current facility-administered medications for this visit.    Allergies as of 09/14/2021 - Review Complete 08/28/2021  Allergen Reaction Noted   Doxycycline Other (See Comments) 08/31/2014    Family History  Problem Relation Age of Onset   Healthy Mother    Healthy Father    Cancer Paternal Grandmother    Colon cancer Neg Hx    Colon polyps Neg Hx    Inflammatory bowel disease Neg Hx     Social History   Socioeconomic History   Marital status: Single    Spouse name: Not on file   Number of children: Not on file   Years of education: Not on file   Highest education level: Not on file  Occupational History   Not on file  Tobacco Use   Smoking status: Former    Types: Cigarettes   Smokeless tobacco: Former    Types: Chew   Tobacco comments:    no cigarettes in 4 years,   Vaping Use   Vaping Use: Never used  Substance and Sexual Activity   Alcohol use: Not Currently    Comment: rare   Drug use: Not Currently    Comment: pain pills 5 years ago, none since then.    Sexual activity: Not Currently  Other Topics Concern   Not on file  Social History Narrative   Not on file   Social Determinants of  Health   Financial Resource Strain: Not on file  Food Insecurity: Not on file  Transportation Needs: Not on file  Physical Activity: Not on file  Stress: Not on file  Social Connections: Not on file    Review of Systems: Gen: Denies fever, chills, cold or flu like symptoms, pre-syncope, or syncope.   CV: Denies chest pain, palpitations. Resp: Denies dyspnea, cough.  GI: See HPI Heme: See HPI  Physical Exam: There were no vitals taken for this visit. General:   Alert and oriented. No distress noted. Pleasant and cooperative.  Head:  Normocephalic and atraumatic. Eyes:  Conjuctiva clear without scleral icterus. Heart:  S1, S2 present without murmurs appreciated. Lungs:  Clear to auscultation bilaterally. No wheezes, rales, or rhonchi. No distress.  Abdomen:  +BS, soft, non-tender and non-distended. No rebound or guarding. No HSM  or masses noted. Msk:  Symmetrical without gross deformities. Normal posture. Extremities:  Without edema. Neurologic:  Alert and  oriented x4 Psych:  Normal mood and affect.    Assessment:     Plan:  ***   Aliene Altes, PA-C [facility] 09/14/2021

## 2021-09-14 ENCOUNTER — Encounter: Payer: Self-pay | Admitting: Gastroenterology

## 2021-09-14 ENCOUNTER — Ambulatory Visit (INDEPENDENT_AMBULATORY_CARE_PROVIDER_SITE_OTHER): Payer: BC Managed Care – PPO | Admitting: Gastroenterology

## 2021-09-14 VITALS — BP 113/70 | HR 71 | Temp 98.5°F | Ht 67.0 in | Wt 150.4 lb

## 2021-09-14 DIAGNOSIS — K649 Unspecified hemorrhoids: Secondary | ICD-10-CM | POA: Diagnosis not present

## 2021-09-14 DIAGNOSIS — K51911 Ulcerative colitis, unspecified with rectal bleeding: Secondary | ICD-10-CM

## 2021-09-14 DIAGNOSIS — K6289 Other specified diseases of anus and rectum: Secondary | ICD-10-CM

## 2021-09-14 LAB — ADALIMUMAB DRUG LEVEL AND ANTI-DRUG AB FOR RHEUMATIC DISEASE
ADALIMUMAB AB,RHEUM: <10 AU (ref <10)
ADALIMUMAB LEVEL, RHEUM: 4.9 ug/mL

## 2021-09-14 LAB — ADALIMUMAB DRUG LEVEL AND ANTIDRUG AB FOR IBD
ADALIMUMAB ADA,IBD: <10 AU (ref <10)
ADALIMUMAB LEVEL,IBD: 6.3 ug/mL

## 2021-09-14 LAB — C DIFFICILE QUICK SCREEN W PCR REFLEX
C Diff antigen: NEGATIVE
C Diff interpretation: NOT DETECTED
C Diff toxin: NEGATIVE

## 2021-09-14 LAB — C-REACTIVE PROTEIN: CRP: 8.8 mg/dL — ABNORMAL HIGH (ref <1.0)

## 2021-09-14 MED ORDER — HYDROCORTISONE (PERIANAL) 2.5 % EX CREA: 1.0000 | TOPICAL_CREAM | Freq: Three times a day (TID) | CUTANEOUS | 1 refills | Status: DC

## 2021-09-14 MED ORDER — PREDNISONE 10 MG PO TABS: 40.0000 mg | ORAL_TABLET | Freq: Every day | ORAL | 0 refills | Status: DC

## 2021-09-14 NOTE — Patient Instructions (Signed)
Start prednisone 40 mg daily.  Continue Humira for now.  We will have further recommendations on this pending your drug and trough level results.   Use Anusol rectal cream 3 times daily for hemorrhoids/rectal pain.  Please complete your stool studies.  If you have any worsening symptoms, feel lightheaded, dizzy, like you will pass out, proceed to the emergency room.  Please call in 1 week with a progress report or sooner if needed.   It was good to see you today. I am sorry you are not feeling well!   Aliene Altes, PA-C [facility]

## 2021-09-15 LAB — GASTROINTESTINAL PANEL BY PCR, STOOL (REPLACES STOOL CULTURE)

## 2021-09-15 LAB — GIARDIA/CRYPTOSPORIDIUM EIA
Cryptosporidium EIA: NEGATIVE
Giardia Ag, Stl: NEGATIVE

## 2021-09-26 ENCOUNTER — Telehealth: Payer: Self-pay

## 2021-09-26 NOTE — Telephone Encounter (Signed)
Pt called stating that he has been using the hemorrhoid cream and that he is now to the point where it hurts to sit. Pt states that the hemorrhoid is swollen and painful. Pt is currently in [place] and is wanting to know what he should do. Pt only notices bleeding when he has to strain. Bm's are of diarrhea consistency at the moment. Please advise.

## 2021-09-26 NOTE — Telephone Encounter (Signed)
Pt was made aware and verbalized understanding.

## 2021-09-26 NOTE — Telephone Encounter (Signed)
Unfortunately, with him being out of town, we are unable to examine the area.  Would recommend continuing to use hemorrhoid cream 2-4 times daily.  He can purchase over-the-counter lidocaine ointment and apply a couple times a day.  Also recommend sitz bath's twice daily.  Limit toilet time to 2-3 minutes.  Avoid straining.  If any worsening symptoms, he will need to be evaluated at urgent care/ED.

## 2021-09-27 ENCOUNTER — Other Ambulatory Visit: Payer: Self-pay | Admitting: Gastroenterology

## 2021-09-27 DIAGNOSIS — K610 Anal abscess: Secondary | ICD-10-CM | POA: Diagnosis not present

## 2021-09-27 DIAGNOSIS — K5229 Other allergic and dietetic gastroenteritis and colitis: Secondary | ICD-10-CM | POA: Diagnosis not present

## 2021-09-27 DIAGNOSIS — K629 Disease of anus and rectum, unspecified: Secondary | ICD-10-CM | POA: Diagnosis not present

## 2021-09-27 DIAGNOSIS — K6289 Other specified diseases of anus and rectum: Secondary | ICD-10-CM | POA: Diagnosis not present

## 2021-09-27 DIAGNOSIS — F17200 Nicotine dependence, unspecified, uncomplicated: Secondary | ICD-10-CM | POA: Diagnosis not present

## 2021-09-27 DIAGNOSIS — K611 Rectal abscess: Secondary | ICD-10-CM | POA: Diagnosis not present

## 2021-09-27 DIAGNOSIS — K51211 Ulcerative (chronic) proctitis with rectal bleeding: Secondary | ICD-10-CM | POA: Diagnosis not present

## 2021-09-27 DIAGNOSIS — K5130 Ulcerative (chronic) rectosigmoiditis without complications: Secondary | ICD-10-CM | POA: Diagnosis not present

## 2021-09-27 DIAGNOSIS — K51911 Ulcerative colitis, unspecified with rectal bleeding: Secondary | ICD-10-CM

## 2021-09-27 NOTE — Progress Notes (Unsigned)
Primary Care Physician:  Caryl Bis, MD  Primary GI: Dr. Gala Romney  Patient Location: On a tour in [place]   Provider Location: [place] office   Reason for Visit: Follow-up ulcerative colitis   Persons present on the virtual encounter, with roles: Aliene Altes, PA-C (Provider), Michael Mcdowell (patient)   Total time (minutes) spent on medical discussion: 12 minutes  Virtual Visit via video Note Due to COVID-19, visit is conducted virtually and was requested by patient.   I connected with Michael Mcdowell on 09/28/21 at  1:00 PM EDT by video and verified that I am speaking with the correct person using two identifiers.   I discussed the limitations, risks, security and privacy concerns of performing an evaluation and management service by video and the availability of in person appointments. I also discussed with the patient that there may be a patient responsible charge related to this service. The patient expressed understanding and agreed to proceed.  Chief Complaint  Patient presents with   Follow-up     History of Present Illness: 31 year old male with history of ulcerative colitis, diagnosed April 2021, found to have mild to moderate active colitis in the cecum, descending colon, sigmoid colon, and rectum.  He failed Entocort, Romycin enema, Lialda, and ultimately required prednisone and started Humira in June 2021.  He is presenting today for close interval follow-up.  We last saw him in the office on 09/14/2021.  He had been doing well on Humira; however, around March 2023, he began to develop intermittent rectal bleeding that was becoming progressively worse with associated abdominal cramping prior to rectal bleeding episodes and mild weight loss.  Denied NSAIDs or missing any Humira doses since March.  He did note getting a small knot on his leg or abdomen after injecting Humira which just started in March.  Labs completed and revealed a hemoglobin of 13.4, down from 15.9, 1  month prior, WBC count and electrolytes within normal limits, sed rate elevated at 47, up from 2, 1 month ago.  CRP also elevated at 8.8.  Stool studies including C. difficile, GI panel, Giardia/cryptosporidia were negative.  Humira antibody and trough levels were drawn and showed no antibodies, but low trough levels.  Recommended starting prednisone 40 mg daily and increasing Humira to weekly dosing.  Also recommended patient contact nurse ambassador to inquire about the reported "knot" that he developed after injecting Humira.   Notably, at the time of his office visit he was also reporting rectal pain/soreness and was found to have external and internal hemorrhoids on exam.  No obvious fissure.  He was started on Anusol rectal cream.  Patient called 7/25 (from [place]) reporting ongoing rectal pain, hurting when sitting.  No significant improvement with hemorrhoid cream.  Felt his hemorrhoids were swollen and painful.  Recommended continuing hemorrhoid cream, using over-the-counter lidocaine ointment, sitz bath's, limit straining, and limit toilet time.  Recommended ER evaluation if worsening symptoms.  Today: Went to ED at [facility] in [place] and yesterday due to severe rectal pain.  States he could barely walk.  Was told he had an abscess. Had CT scan.  States he was told he was extremely constipated and had a lot of inflammation.  States the surgeon came in and lanced the abscess to drain it.  He was given some medications while in the ER and prescribed 3 antibiotics which she has not picked up yet. He is unsure of the name of the antibiotics.  He is  on tour in [place] and his paperwork is in the car.  Surgeon did tell him not to take the steroids while on antibiotics.  Rectal pain has significant improved. Draining very little.   Had 1 BM this week. Hasn't been taking anything to help with constipation. Rectal bleeding much improved since starting prednisone. Only if straining. Mild  abdominal discomfort from gas. Hasn't gotten updated Rx of Humira. No fever, chills, or further weight loss. Energy levels are better.   Comes home tomorrow around lunch.   He did speak with nurse ambassador about the bulge he gets at injection site and was told this was ok and should impact efficacy.    Past Medical History:  Diagnosis Date   Substance abuse in remission [facility])    Ulcerative colitis ([place]) 06/2019     Past Surgical History:  Procedure Laterality Date   BIOPSY  06/08/2019   Procedure: BIOPSY;  Surgeon: Daneil Dolin, MD;  [facility];  Service: Endoscopy;;   COLONOSCOPY WITH PROPOFOL N/A 06/08/2019   Inflamed rectum and left colon likely representing UC in evolution. Cecal: midly active colitis, de=scending colon with moderately active colitis, sigmoid with moderately active colitis, rectum with moderately active colitis   Tyhroglossal duct cyst       Current Meds  Medication Sig   acetaminophen (TYLENOL) 500 MG tablet Take 1,000 mg by mouth as needed for moderate pain.    [DISCONTINUED] Adalimumab (HUMIRA) 98 MG/0.8ML PSKT INJECT 40 MG UNDER THE SKIN EVERY 60 DAYS     Family History  Problem Relation Age of Onset   Healthy Mother    Healthy Father    Cancer Paternal Grandmother    Colon cancer Neg Hx    Colon polyps Neg Hx    Inflammatory bowel disease Neg Hx     Social History   Socioeconomic History   Marital status: Single    Spouse name: Not on file   Number of children: Not on file   Years of education: Not on file   Highest education level: Not on file  Occupational History   Not on file  Tobacco Use   Smoking status: Some Days    Types: Cigarettes   Smokeless tobacco: Former    Types: Chew   Tobacco comments:    no cigarettes in 4 years,   Vaping Use   Vaping Use: Never used  Substance and Sexual Activity   Alcohol use: Yes    Comment: rare   Drug use: Not Currently    Comment: pain pills 5 years ago, none since then.     Sexual activity: Not Currently  Other Topics Concern   Not on file  Social History Narrative   Not on file   Social Determinants of Health   Financial Resource Strain: Not on file  Food Insecurity: Not on file  Transportation Needs: Not on file  Physical Activity: Not on file  Stress: Not on file  Social Connections: Not on file       Review of Systems: Gen: Denies fever, chills, cold or flulike symptoms, presyncope, syncope CV: Denies chest pain, palpitations. Resp: Denies dyspnea, cough. GI: see HPI Heme: See HPI  Observations/Objective: No distress. Alert and oriented. Pleasant. Well nourished. Normal mood and affect. Unable to perform complete physical exam due to video encounter.    Assessment:  31 year-old male with history of ulcerative colitis, diagnosed April 2021, found to have mild to moderate active colitis in the cecum, descending colon,  sigmoid colon, and rectum.  He failed Entocort, Romycin enema, Lialda, and ultimately required prednisone and started Humira in June 2021.  He is presenting today for close interval follow-up due to recent flare.   Ulcerative Colitis: Symptoms have been well controlled on Humira biweekly dosing until around March 2023 when he began to have toilet tissue hematochezia that was progressive in nature.  At his last visit on 09/14/2021, he was having significant rectal bleeding up to 10 times per day, abdominal pain/cramping, fatigue, and noted 5 pound weight loss in the last few months.  Labs revealed hemoglobin of 13.4, sed rate elevated at 47, CRP elevated at 8.8.  Stool studies were negative.  Humira antibody levels negative, trough levels were low.  He was started on prednisone 40 mg daily, also recommended increasing Humira to weekly dosing.  Rectal bleeding significantly improved on prednisone, abdominal pain and fatigue also improved.  He has not yet received his updated prescription of Humira.  Unfortunately, he developed severe rectal  pain and was evaluated in the emergency room at [facility] in [place] on 7/26.  These records are not available to me, but patient states he was found to have a perirectal abscess for which the surgeon drained and prescribed him 3 antibiotics which he has not started yet and is unable to tell me the name of the antibiotics as he is on a tour right now and paperwork is in his car. States surgeon told him not to take steroids while on antibiotics. Also reports he had a CT scan that showed he was extremely constipated and also had a lot of inflammation. Clinically he endorses constipation as well which may be secondary to significant colonic inflammation vs development of colonic stricture.   I will plan to request records from [facility] and review case with Dr. Jenetta Downer in the absence of Dr. Gala Romney. I do think he needs prednisone as his disease is uncontrolled, especially while waiting for increase dose of Humira to take effect. Will determine when to resume steroids after discussion with Dr. Jenetta Downer. He will start MiraLAX for constipation.    He is overdue for colonoscopy. Perirectal abscess raises the concern for Crohn's disease rather than UC. His last colonoscopy was in 2021 at the time of diagnosis with inflamed rectum and left colon with focal inflammation of cecum, no mention of TI. We were hoping to pursue this once his disease went back into clinical remission; however we may need to consider updating sooner to restage/evaluate extent of disease. Will discuss with Dr. Jenetta Downer.    Plan: Request records from [facility] ASAP. Take course of antibiotics prescribed by general surgeon at [facility]. Continue to hold steroids for now as recommended by general surgery. Continue current dose of Humira q14 days until he receives updated prescription for weekly dosing. I sent in a new Rx today. Unclear what happened to this previously.  Start MiraLAX 17 g daily in 8  ounces of water for constipation. Will review case with Dr. Jenetta Downer. Further recommendations to follow.      I discussed the assessment and treatment plan with the patient. The patient was provided an opportunity to ask questions and all were answered. The patient agreed with the plan and demonstrated an understanding of the instructions.   The patient was advised to call back or seek an in-person evaluation if the symptoms worsen or if the condition fails to improve as anticipated.  I provided 12 minutes of video-face-to-face  time during this encounter.  Aliene Altes, PA-C [facility]  09/28/2021

## 2021-09-28 ENCOUNTER — Encounter: Payer: Self-pay | Admitting: Gastroenterology

## 2021-09-28 ENCOUNTER — Telehealth (INDEPENDENT_AMBULATORY_CARE_PROVIDER_SITE_OTHER): Payer: BC Managed Care – PPO | Admitting: Gastroenterology

## 2021-09-28 VITALS — Ht 67.0 in | Wt 155.0 lb

## 2021-09-28 DIAGNOSIS — K5900 Constipation, unspecified: Secondary | ICD-10-CM | POA: Diagnosis not present

## 2021-09-28 DIAGNOSIS — K611 Rectal abscess: Secondary | ICD-10-CM | POA: Diagnosis not present

## 2021-09-28 DIAGNOSIS — K51911 Ulcerative colitis, unspecified with rectal bleeding: Secondary | ICD-10-CM

## 2021-09-28 MED ORDER — HUMIRA 40 MG/0.8ML ~~LOC~~ PSKT: PREFILLED_SYRINGE | SUBCUTANEOUS | 5 refills | Status: DC

## 2021-09-28 NOTE — Patient Instructions (Addendum)
I am requesting records from [facility] for review. I will be in contact with you soon with further recommendations.   Continue to hold steroids for now as the general surgeon recommended. I will review you case with Dr. Jenetta Downer.   Take antibiotics prescribed by ER.   We will look into what is going on with your Humira and why you haven't received the updated prescription yet.   Start MiraLAX 17 g daily in 8 oz of water. You can increase to twice daily if needed.   Aliene Altes, PA-C [facility]

## 2021-09-29 ENCOUNTER — Telehealth: Payer: Self-pay | Admitting: Gastroenterology

## 2021-09-29 NOTE — Telephone Encounter (Signed)
Case reviewed with Dr. Jenetta Downer. Recommended holding Humira x 2 weeks and resuming steroids for now, complete antibiotic course provided by ER/general surgeon. Stated patient needed to be evaluated next week in person and would likely need an MRI to evaluate for fistulous track between abscess area and rectum, but this can be addressed once we see patein in the office.  Spoke with patient. His last dose of Humira was last Saturday or Sunday (7/22 or 7/23). He is aware to hold Humira for now, but resume prednisone 40 mg daily. He is on his way back from [place] now and stated he would be able to come in at 3:30 on Monday for OV.   Manuela Schwartz:  Please schedule patient with me on Monday at 3:30 pm.

## 2021-10-02 ENCOUNTER — Ambulatory Visit (INDEPENDENT_AMBULATORY_CARE_PROVIDER_SITE_OTHER): Payer: BC Managed Care – PPO | Admitting: Gastroenterology

## 2021-10-02 ENCOUNTER — Encounter: Payer: Self-pay | Admitting: Gastroenterology

## 2021-10-02 VITALS — BP 125/72 | HR 73 | Temp 97.7°F | Ht 67.0 in | Wt 158.8 lb

## 2021-10-02 DIAGNOSIS — K51911 Ulcerative colitis, unspecified with rectal bleeding: Secondary | ICD-10-CM | POA: Diagnosis not present

## 2021-10-02 DIAGNOSIS — K5900 Constipation, unspecified: Secondary | ICD-10-CM

## 2021-10-02 DIAGNOSIS — K611 Rectal abscess: Secondary | ICD-10-CM | POA: Diagnosis not present

## 2021-10-02 MED ORDER — PREDNISONE 10 MG PO TABS: 50.0000 mg | ORAL_TABLET | Freq: Every day | ORAL | 0 refills | Status: DC

## 2021-10-02 NOTE — Patient Instructions (Signed)
Please have blood work completed at [facility].  We will arrange for you to have an MRI of your pelvis at [facility] your course of antibiotics.  Increase prednisone to 50 mg daily for now.  Continue to hold Humira for now.  I will call you with your lab results and MRI results.  Further recommendations at that time.  Please call me if you have any worsening symptoms.  If you feel lightheaded, dizzy, significantly weak, like you will pass out, proceed to the emergency room.  Aliene Altes, PA-C [facility]

## 2021-10-02 NOTE — Progress Notes (Signed)
Referring Provider: Caryl Bis, MD Primary Care Physician:  Michael Bis, MD Primary GI Physician: Dr. Gala Romney  Chief Complaint  Patient presents with   Follow-up    HPI:   Michael Mcdowell is a 31 y.o. male with history of ulcerative colitis, diagnosed April 2021, found to have mild to moderate active colitis in the cecum, descending colon, sigmoid colon, and rectum.  He failed Entocort, Row[place] enema, Lialda, and ultimately required prednisone and started Humira biweekly in June 2021 which had been working well for him until March 2023 when he began to have toilet tissue hematochezia that became progressive in nature. On 09/13/21, he was found to have  sed rate elevated at 47, CRP elevated at 8.8.  Stool studies were negative.  Humira antibody levels negative, trough levels were low.  He was started on Prednisone 40 mg daily on 7/13 with plans to increase Humira to weekly dosing.  Unfortunately, while patient was out of town in [place], he developed severe rectal pain and was evaluated in the emergency room at [facility] in [place] on 7/26.  These records have not yet become available to me, but I saw patient via virtual visit 7/27, and he reported he was found to have a perirectal abscess which was drained by a surgeon and was started on antibiotics.  Stated he also had a CT scan that showed he was constipated and also had significant colonic inflammation.  Clinically, he was feeling much improved at that time.  Stated he only had little drainage from the abscess and his prior rectal bleeding had significantly improved with prednisone.  Recommended starting MiraLAX for constipation and plan discussed case with Dr. Jenetta Downer in the absence of Dr. Gala Romney regarding management of steroid/Humira in the setting of perirectal abscess.  Case reviewed with Dr. Jenetta Downer. Recommended holding Humira x 2 weeks and resuming steroids for now, complete antibiotic course provided by ER/general  surgeon. Stated patient needed to be evaluated next week in person and would likely need an MRI to evaluate for fistulous track between abscess area and rectum, but this can be addressed once we see patient in the office.  Today:  Started MiraLAX BID Friday. Has had 2 large, normal Bms since then. One on Saturday and one today. Between this, he has been passing blood per rectum every couple of hours. Reports rectal bleeding is overall improved in quantity since starting Prednisone, but is still frequent. It had slacked off a little more, but seemed to pick back up on Friday somewhat. He had stopped Prednisone for 2 days due to surgeons recommendations but resumed on Friday at 40 mg daily. He has been taking toradol q6 hours for rectal pain along with cipro and flagyl. Reports he was prescribed 2 weeks og antibiotics. Denies any rectal pain at this point. No real drainage. He is taking cipro and flagyl and states he was prescribed a 2 week course.   Denies any significant abdominal pain, more discomfort related to gas/bloating intermittently.   Fatigue somewhat improved, but not working as hard as normal.   Weight has been stable.   Last dose of Humira 09/24/21.   Consider IBD specialist    Past Medical History:  Diagnosis Date   Substance abuse in remission [facility])    Ulcerative colitis ([place]) 06/2019    Past Surgical History:  Procedure Laterality Date   BIOPSY  06/08/2019   Procedure: BIOPSY;  Surgeon: Daneil Dolin, MD;  [facility];  Service: Endoscopy;;   COLONOSCOPY WITH PROPOFOL N/A 06/08/2019   Inflamed rectum and left colon likely representing UC in evolution. Cecal: midly active colitis, de=scending colon with moderately active colitis, sigmoid with moderately active colitis, rectum with moderately active colitis   Tyhroglossal duct cyst      Current Outpatient Medications  Medication Sig Dispense Refill   acetaminophen (TYLENOL) 500 MG tablet Take 1,000 mg by mouth  as needed for moderate pain.      ciprofloxacin (CIPRO) 500 MG tablet SMARTSIG:1 Tablet(s) By Mouth Every 12 Hours     ketorolac (TORADOL) 10 MG tablet Take 10 mg by mouth every 6 (six) hours as needed.     metroNIDAZOLE (FLAGYL) 500 MG tablet Take 500 mg by mouth every 8 (eight) hours.     predniSONE (DELTASONE) 10 MG tablet Take 5 tablets (50 mg total) by mouth daily. 35 tablet 0   Adalimumab (HUMIRA) 40 MG/0.8ML PSKT INJECT 40 MG UNDER THE SKIN EVERY 7 DAYS (Patient not taking: Reported on 10/02/2021) 4 each 5   No current facility-administered medications for this visit.    Allergies as of 10/02/2021 - Review Complete 10/02/2021  Allergen Reaction Noted   Doxycycline Other (See Comments) 08/31/2014    Family History  Problem Relation Age of Onset   Healthy Mother    Healthy Father    Cancer Paternal Grandmother    Colon cancer Neg Hx    Colon polyps Neg Hx    Inflammatory bowel disease Neg Hx     Social History   Socioeconomic History   Marital status: Single    Spouse name: Not on file   Number of children: Not on file   Years of education: Not on file   Highest education level: Not on file  Occupational History   Not on file  Tobacco Use   Smoking status: Some Days    Types: Cigarettes   Smokeless tobacco: Former    Types: Chew   Tobacco comments:    no cigarettes in 4 years,   Vaping Use   Vaping Use: Never used  Substance and Sexual Activity   Alcohol use: Yes    Comment: rare   Drug use: Not Currently    Comment: pain pills 5 years ago, none since then.    Sexual activity: Not Currently  Other Topics Concern   Not on file  Social History Narrative   Not on file   Social Determinants of Health   Financial Resource Strain: Not on file  Food Insecurity: Not on file  Transportation Needs: Not on file  Physical Activity: Not on file  Stress: Not on file  Social Connections: Not on file    Review of Systems: Gen: Denies fever, chills, cold or  flulike symptoms, presyncope, syncope. CV: Denies chest pain, palpitations. Resp: Denies dyspnea ,  GI: See HPI Heme: See HPI  Physical Exam: BP 125/72 (BP Location: Right Arm, Patient Position: Sitting, Cuff Size: Normal)   Pulse 73   Temp 97.7 F (36.5 C) (Temporal)   Ht 5' 7"  (1.702 m)   Wt 158 lb 12.8 oz (72 kg)   BMI 24.87 kg/m  General:   Alert and oriented. No distress noted. Pleasant and cooperative.  Head:  Normocephalic and atraumatic. Eyes:  Conjuctiva clear without scleral icterus. Heart:  S1, S2 present without murmurs appreciated. Lungs:  Clear to auscultation bilaterally. No wheezes, rales, or rhonchi. No distress.  Abdomen:  +BS, soft, non-tender and non-distended. No rebound or guarding. No  HSM or masses noted. Rectal: Evidence of recent lancing of perirectal abscess at the 6 o'clock position adjacent to the anus without discharge.  Also with perianal skin tag.  Attempted internal exam, but due to discomfort, this is very limited. Msk:  Symmetrical without gross deformities. Normal posture. Extremities:  Without edema. Neurologic:  Alert and  oriented x4 Psych:  Normal mood and affect.    Assessment:  31 year old male with IBD, previously diagnosed with ulcerative colitis in April 2021, on Humira, presenting today for follow-up of recent flare and peri-rectal abscess.   IBD with rectal bleeding, peri-rectal abscess, and constipation:  Previously diagnosed as UC in April 2021.  Symptoms had been well controlled on Humira biweekly which was started in June 2021, but he began to experience toilet tissue hematochezia that became progressive in nature and was found to have a flare in July 2023. Elevated sed rate of 47, CRP of 8.8. Humira antibody levels negative, trough levels were low. Started on Prednisone 40 mg daily on 7/13 with plans to increase Humira to weekly dosing, but patient has not started this yet. While in [place] last week,  he developed severe rectal  pain and was evaluated in the emergency room at [facility] on 7/26 and per his report was found to have a perirectal abscess that was drained by a surgeon.  States he also had a CT scan that showed he was constipated and had significant colonic inflammation.  These records have not yet become available to me, but we have requested them for review.  He is currently on Cipro and Flagyl for perirectal abscess and is also taking Toradol every 6 hours prescribed by ER provider.  Humira is on hold due to abscess, last dose was 7/23.  He has continued on prednisone 40 mg daily. Clinically, rectal pain has resolved, no real drainage. Constipation improving with MiraLAX.  No significant abdominal pain, but he continues with frequent rectal bleeding though decreased quantity since starting prednisone. Query whether Toradol is contributing to his rectal bleeding at this point.  On exam today, his abdomen is benign.  He does have evidence of recent lancing of perirectal abscess at the 6 o'clock position adjacent to the anus without discharge, perianal skin tag.  Attempted internal exam, but due to discomfort, this was limited.  In the setting of per-rectal abscess, query whether patient has Crohn's disease rather than ulcerative colitis.  We will plan for MRI of the pelvis to further evaluate perirectal abscess to ensure he does not have a fistula extending to the rectum.  As he continues with frequent rectal bleeding, I will temporarily increase his prednisone to 50 mg daily as he has required prednisone 50 mg in the distant past and did note some improvement with this.  Also recommended discontinuing Toradol and completing course of antibiotics for perirectal abscess.  We will also update a CBC to evaluate for worsening anemia in the setting of persistent rectal bleeding.   We discussed that he may end up needing a referral to IBD specialty clinic in the future.   Regarding constipation, this may be secondary  to significant colonic inflammation and can't rule out development of a stricture.  As he has had improvement with MiraLAX, we will continue this twice daily for now.  Case was discussed with Dr. Abbey Chatters who agreed with current plan.   Plan:  CBC MRI pelvis with and without contrast ASAP Complete course of ciprofloxacin and metronidazole. Stop Toradol. Increase prednisone to 50  mg daily for now.  Continue to hold Humira for now. Follow-up on records from [place] when they become available.  Will likely need to consider referral to IBD specialist in the near future.  Discussed ER precautions. Further recommendations pending labs and MRI results.    Aliene Altes, PA-C [facility] 10/02/2021

## 2021-10-03 ENCOUNTER — Telehealth: Payer: Self-pay | Admitting: *Deleted

## 2021-10-03 NOTE — Addendum Note (Signed)
Addended by: Cheron Every on: 10/03/2021 08:35 AM   Modules accepted: Orders

## 2021-10-03 NOTE — Telephone Encounter (Signed)
PA approved via [employer] for MRI pelvis but insurance would only approved GI in [place].  Called pt and is aware.   PA auth# [id number], DOS 10/03/21-11/01/21

## 2021-10-04 ENCOUNTER — Inpatient Hospital Stay: Admission: RE | Admit: 2021-10-04 | Payer: BC Managed Care – PPO | Source: Ambulatory Visit

## 2021-10-12 ENCOUNTER — Telehealth: Payer: Self-pay | Admitting: Gastroenterology

## 2021-10-12 ENCOUNTER — Ambulatory Visit
Admission: RE | Admit: 2021-10-12 | Discharge: 2021-10-12 | Disposition: A | Discharge status: Home / Self Care | Payer: BC Managed Care – PPO | Source: Ambulatory Visit | Attending: Gastroenterology | Admitting: Gastroenterology

## 2021-10-12 DIAGNOSIS — K6289 Other specified diseases of anus and rectum: Secondary | ICD-10-CM | POA: Diagnosis not present

## 2021-10-12 DIAGNOSIS — K5130 Ulcerative (chronic) rectosigmoiditis without complications: Secondary | ICD-10-CM | POA: Diagnosis not present

## 2021-10-12 MED ORDER — GADOBENATE DIMEGLUMINE 529 MG/ML IV SOLN
14.0000 mL | Freq: Once | INTRAVENOUS | Status: AC | PRN
  Administered 2021-10-12: 14 mL via INTRAVENOUS

## 2021-10-12 NOTE — Telephone Encounter (Signed)
Spoke with patient today.   Reports his bowels are moving somewhat better with MiraLAX and bloating has improved. Continues with daily rectal bleeding. Has been taking prednisone 50 mg daily, but ran out yesterday. He is going to the pharmacy to pick up a refill today. Completed his course of antibiotics for rectal abscess 2 days ago and has no residual symptoms. He is going for his MRI today. He hasn't started Humira back as this was on hold x2 weeks due to rectal abscess. As he is feeling much better and last dose of Humira was 2.5 weeks ago, he will go ahead and resume this.   He still hasn't received the new Rx for weekly dosing of Humira.   Loma Sousa, can you please find out what is going on with this patient's Humira? I sent in a RX for weekly dosing on 7/27, but patient hasn't received this. We need to get this to him ASAP.

## 2021-10-12 NOTE — Telephone Encounter (Signed)
Spoke to representative from [facility] and they stated that medication is waiting on approval from pharmacist. Asked to please check on this.

## 2021-10-13 NOTE — Telephone Encounter (Signed)
Reviewed with Dr. Gala Romney. Recommended updating flex sig Wednesday or Thursday next week (8/16 or 8/17) to rule out CMV. Patient also needs to complete CBC as previously ordered. We will see MRI shows. Would be nice to have CT from [place] which I have not received yet. Patient will continue steroids for now and hope to start Humira weekly in the very near future.   Recommendations discussed with patient. He will have CBC completed today or Monday morning. He is available on Thursday for flex sig.   [name]:  Please arrange for flex sig on Thursday, 8/17.  Dx: UC, rectal bleeding ASA 2 Prep with 2 tap water enemas morning of.   Manuela Schwartz:  We need CT scan from [facility] in [place]. We requested this previously, but I have not received anything.   Tammy:  Can you follow-up on Humira Rx on Monday as Loma Sousa will be out?

## 2021-10-16 ENCOUNTER — Encounter: Payer: Self-pay | Admitting: *Deleted

## 2021-10-16 NOTE — Telephone Encounter (Signed)
Contacted [facility] and they stated that they have the new rx for the weekly dose of Humira. They state that they have reached out to the patient twice to arrange shipment and have been unable to reach him. I contacted the patient and instructed him to contact [facility] to arrange shipment. Patient verbalized understanding.

## 2021-10-16 NOTE — Telephone Encounter (Signed)
Patient has been scheduled for 10/19/21 at 2:30 pm. Instructions have been printed for the patient to come by and pick up.

## 2021-10-16 NOTE — Telephone Encounter (Signed)
Noted. Thanks.

## 2021-10-17 NOTE — Telephone Encounter (Signed)
Noted  

## 2021-10-18 ENCOUNTER — Telehealth: Payer: Self-pay | Admitting: *Deleted

## 2021-10-18 NOTE — Telephone Encounter (Signed)
Communication noted.  

## 2021-10-18 NOTE — Telephone Encounter (Signed)
Spoke to pt, informed him to keep up coming appointment with surgical center. Pt stated he has not had labs completed yet, but would try and have labs done this week. Informed to please have done so we can determine if he needs an antibiotic. Pt voiced understanding.

## 2021-10-18 NOTE — Telephone Encounter (Signed)
Spoke to pt, he informed me that the did speak to [facility] about his Humira and it should be delivered before the end of the week. Also pt states that he has 2 perirectal abscess and they are in a different area. He has an appointment with the surgical center that he was referred to next Tuesday.

## 2021-10-18 NOTE — Telephone Encounter (Signed)
Noted. Keep upcoming appoint with surgery. Waiting on CBC to be completed so I can see how his hematoglobin is and if his white count is elevated. If any worsening symptoms, proceed to the ED. Unfortunately, we can not do anything about he abscesses such as draining them. Once I get his CBC, I can determine if we will start him on a course of antibiotics while waiting to see general surgery.

## 2021-10-19 ENCOUNTER — Ambulatory Visit (HOSPITAL_COMMUNITY)
Admission: RE | Admit: 2021-10-19 | Payer: BC Managed Care – PPO | Source: Home / Self Care | Admitting: Internal Medicine

## 2021-10-19 ENCOUNTER — Encounter (HOSPITAL_COMMUNITY): Admission: RE | Payer: Self-pay | Source: Home / Self Care

## 2021-10-19 ENCOUNTER — Other Ambulatory Visit: Payer: Self-pay | Admitting: Gastroenterology

## 2021-10-19 DIAGNOSIS — K51911 Ulcerative colitis, unspecified with rectal bleeding: Secondary | ICD-10-CM | POA: Diagnosis not present

## 2021-10-19 LAB — CBC WITH DIFFERENTIAL/PLATELET
Absolute Monocytes: 238 cells/uL (ref 200–950)
Basophils Absolute: 16 cells/uL (ref 0–200)
Basophils Relative: 0.2 %
Eosinophils Absolute: 33 cells/uL (ref 15–500)
Eosinophils Relative: 0.4 %
HCT: 39.2 % (ref 38.5–50.0)
Hemoglobin: 13.3 g/dL (ref 13.2–17.1)
Lymphs Abs: 894 cells/uL (ref 850–3900)
MCH: 30.5 pg (ref 27.0–33.0)
MCHC: 33.9 g/dL (ref 32.0–36.0)
MCV: 89.9 fL (ref 80.0–100.0)
MPV: 10.2 fL (ref 7.5–12.5)
Monocytes Relative: 2.9 %
Neutro Abs: 7019 cells/uL (ref 1500–7800)
Neutrophils Relative %: 85.6 %
Platelets: 288 10*3/uL (ref 140–400)
RBC: 4.36 10*6/uL (ref 4.20–5.80)
RDW: 12.3 % (ref 11.0–15.0)
Total Lymphocyte: 10.9 %
WBC: 8.2 10*3/uL (ref 3.8–10.8)

## 2021-10-19 SURGERY — SIGMOIDOSCOPY, FLEXIBLE
Anesthesia: Monitor Anesthesia Care

## 2021-10-19 MED ORDER — PREDNISONE 10 MG PO TABS: 50.0000 mg | ORAL_TABLET | Freq: Every day | ORAL | 0 refills | Status: DC

## 2021-10-24 ENCOUNTER — Ambulatory Visit: Payer: Self-pay | Admitting: General Surgery

## 2021-10-24 ENCOUNTER — Telehealth: Payer: Self-pay

## 2021-10-24 DIAGNOSIS — K603 Anal fistula: Secondary | ICD-10-CM | POA: Diagnosis not present

## 2021-10-24 NOTE — Telephone Encounter (Signed)
Noted  

## 2021-10-24 NOTE — Telephone Encounter (Signed)
Reviewed.  On exam, patient was found to have branching type posterior fistula with external openings along the left and right posterior perineal space.  Dr. Marcello Moores has recommended exam under anesthesia with possible seton placement.  Patient is currently scheduled for exam under anesthesia on 9/22.  I spoke with patient today.  He reports Dr. Marcello Moores did not recommend antibiotics at this time as he is not having any rectal pain.  He also reports his rectal bleeding is improving.  He has had 2 doses of Humira since restarting and is scheduled to have another dose of Humira this Thursday for weekly dosing.  Unfortunately, he has not yet received his updated prescription.  We received request yesterday from [facility] for additional information regarding the updated prescription.  Paperwork was filled out and returned to [place], [place].  Courtney, do we have any updates yet on Humira weekly dose? If you don't hear anything today, can you call tomorrow to follow-up?

## 2021-10-24 NOTE — Telephone Encounter (Signed)
Consult note from [facility] is in patient's chart under media for review.

## 2021-10-24 NOTE — H&P (Signed)
   REFERRING PHYSICIAN:  Erenest Rasher  PROVIDER:  Monico Blitz, MD  MRN: [id number] DOB: [birth year] DATE OF ENCOUNTER: 10/24/2021  Subjective   Chief Complaint: New Consultation     History of Present Illness: Michael Mcdowell is a 31 y.o. male who is seen today as an office consultation at the request of Dr. Jodi Mourning for evaluation of New Consultation .    Patient with diagnosis of ulcerative colitis on Humira with a recent flare requiring prednisone.  He developed rectal pain in July while he was in [place] and a perirectal abscess was noted.  This was drained.  He completed a course of antibiotics and steroids.  He underwent an MRI approximately 10 days later which showed a small intersphincteric fistula arising from the posterior wall.     Review of Systems: A complete review of systems was obtained from the patient.  I have reviewed this information and discussed as appropriate with the patient.  See HPI as well for other ROS.    Medical History: History reviewed. No pertinent past medical history.  There is no problem list on file for this patient.   History reviewed. No pertinent surgical history.   Allergies  Allergen Reactions   Doxycycline Other (See Comments)    Possible reaction.  Pt not sure.  Unknown reaction.    Current Outpatient Medications on File Prior to Visit  Medication Sig Dispense Refill   HUMIRA 40 mg/0.8 mL prefilled syringe kit INJECT 40 MG UNDER THE SKIN EVERY 7 DAYS     predniSONE (DELTASONE) 10 MG tablet Take by mouth     No current facility-administered medications on file prior to visit.    History reviewed. No pertinent family history.   Social History   Tobacco Use  Smoking Status Every Day   Types: Cigarettes  Smokeless Tobacco Never     Social History   Socioeconomic History   Marital status: Single  Tobacco Use   Smoking status: Every Day    Types: Cigarettes   Smokeless tobacco: Never    Objective:     Vitals:   10/24/21 1042  BP: 120/76  Pulse: 70  Weight: 67.8 kg (149 lb 6.4 oz)  Height: 170.2 cm (5' 7" )     Exam Gen: NAD Abd: soft Rectal: Appears to have a branching type posterior fistula with external openings along the left and right posterior perianal space.   Labs, Imaging and Diagnostic Testing: MRI reviewed  Assessment and Plan:  Diagnoses and all orders for this visit:  Anal fistula    On exam today patient appears to have a branching fistula more consistent with Crohn's disease.  We discussed performing an exam under anesthesia with possible seton placement so that he can get back on his biologic therapy.  No follow-ups on file.    Rosario Adie, MD Colon and Rectal Surgery [facility]

## 2021-10-25 NOTE — Telephone Encounter (Signed)
Spoke to [facility] rep and he informed me that pt's Humira will be shipped to pt house on Aug 25. I called and spoke to pt and informed him of information. Informed him to call and let me know if medication comes or not.

## 2021-10-25 NOTE — Telephone Encounter (Signed)
Noted. Patient will decrease prednisone to 40 mg daily on Thursday and we will touch base next week to see how he is doing. He is already aware of this.

## 2021-10-26 ENCOUNTER — Telehealth: Payer: Self-pay | Admitting: *Deleted

## 2021-10-26 ENCOUNTER — Ambulatory Visit
Admission: EM | Admit: 2021-10-26 | Discharge: 2021-10-26 | Disposition: A | Discharge status: Home / Self Care | Payer: BC Managed Care – PPO | Attending: Nurse Practitioner | Admitting: Nurse Practitioner

## 2021-10-26 DIAGNOSIS — L03011 Cellulitis of right finger: Secondary | ICD-10-CM

## 2021-10-26 MED ORDER — SULFAMETHOXAZOLE-TRIMETHOPRIM 800-160 MG PO TABS: 1.0000 | ORAL_TABLET | Freq: Two times a day (BID) | ORAL | 0 refills | Status: AC

## 2021-10-26 NOTE — ED Triage Notes (Signed)
Pt reports pain, swelling in the right ring finger x 1 week. Pt reports he bites his fingers.

## 2021-10-26 NOTE — Telephone Encounter (Signed)
Noted  

## 2021-10-26 NOTE — ED Provider Notes (Signed)
[facility]    CSN: [id number] Arrival date & time: 10/26/21  2993      History   Chief Complaint Chief Complaint  Patient presents with   finger problem    HPI Michael Mcdowell is a 31 y.o. male.   The history is provided by the patient.   Patient presents for 1 week history of pain and swelling of the right ring finger.  Patient states since his symptoms started, he has had increased pain that presents when he touches or hits the finger.  He states that he was able to drain "what looked like pus" out of the finger over the past several days.  He denies fever, chills, chest pain, abdominal pain, nausea, vomiting, or diarrhea.  Patient states that he does bite his nails quite often, especially when he gets nervous.  He has been taking Tylenol for his symptoms.  Past Medical History:  Diagnosis Date   Substance abuse in remission [facility])    Ulcerative colitis ([place]) 06/2019    Patient Active Problem List   Diagnosis Date Noted   Constipation 09/28/2021   Perirectal abscess 09/28/2021   Diarrhea 08/19/2019   Ulcerative colitis with rectal bleeding ([place]) 07/10/2019   Rectal bleeding 05/20/2019   Colitis 05/20/2019   KNEE PAIN, RIGHT 10/15/2007   TALIPES CAVUS 10/15/2007    Past Surgical History:  Procedure Laterality Date   BIOPSY  06/08/2019   Procedure: BIOPSY;  Surgeon: Daneil Dolin, MD;  Location: [facility];  Service: Endoscopy;;   COLONOSCOPY WITH PROPOFOL N/A 06/08/2019   Inflamed rectum and left colon likely representing UC in evolution. Cecal: midly active colitis, de=scending colon with moderately active colitis, sigmoid with moderately active colitis, rectum with moderately active colitis   Tyhroglossal duct cyst         Home Medications    Prior to Admission medications   Medication Sig Start Date End Date Taking? Authorizing Provider  sulfamethoxazole-trimethoprim (BACTRIM DS) 800-160 MG tablet Take 1 tablet by mouth 2 (two) times daily for  7 days. 10/26/21 11/02/21 Yes Delshawn Stech-Warren, Alda Lea, NP  acetaminophen (TYLENOL) 500 MG tablet Take 500-1,000 mg by mouth every 6 (six) hours as needed (pain.).    [provider]  Adalimumab (HUMIRA) 40 MG/0.8ML PSKT INJECT 40 MG UNDER THE SKIN EVERY 7 DAYS Patient taking differently: Take 40 mg by mouth every 14 (fourteen) days. 09/28/21   Erenest Rasher, PA-C  predniSONE (DELTASONE) 10 MG tablet Take 5 tablets (50 mg total) by mouth daily. 10/19/21   Erenest Rasher, PA-C    Family History Family History  Problem Relation Age of Onset   Healthy Mother    Healthy Father    Cancer Paternal Grandmother    Colon cancer Neg Hx    Colon polyps Neg Hx    Inflammatory bowel disease Neg Hx     Social History Social History   Tobacco Use   Smoking status: Some Days    Types: Cigarettes   Smokeless tobacco: Former    Types: Chew   Tobacco comments:    no cigarettes in 4 years,   Vaping Use   Vaping Use: Never used  Substance Use Topics   Alcohol use: Yes    Comment: rare   Drug use: Not Currently    Comment: pain pills 5 years ago, none since then.      Allergies   Vibramycin [doxycycline]   Review of Systems Review of Systems Per HPI  Physical  Exam Triage Vital Signs ED Triage Vitals  Enc Vitals Group     BP 10/26/21 0958 132/75     Pulse Rate 10/26/21 0958 73     Resp 10/26/21 0958 16     Temp 10/26/21 0958 98.4 F (36.9 C)     Temp Source 10/26/21 0958 Oral     SpO2 10/26/21 0958 97 %     Weight --      Height --      Head Circumference --      Peak Flow --      Pain Score 10/26/21 0957 7     Pain Loc --      Pain Edu? --      Excl. in [place]? --    No data found.  Updated Vital Signs BP 132/75 (BP Location: Right Arm)   Pulse 73   Temp 98.4 F (36.9 C) (Oral)   Resp 16   SpO2 97%   Visual Acuity Right Eye Distance:   Left Eye Distance:   Bilateral Distance:    Right Eye Near:   Left Eye Near:    Bilateral Near:     Physical  Exam Vitals and nursing note reviewed.  Constitutional:      General: He is not in acute distress.    Appearance: He is well-developed.  HENT:     Head: Normocephalic and atraumatic.  Eyes:     Conjunctiva/sclera: Conjunctivae normal.     Pupils: Pupils are equal, round, and reactive to light.  Cardiovascular:     Rate and Rhythm: Normal rate and regular rhythm.     Heart sounds: No murmur heard. Pulmonary:     Effort: Pulmonary effort is normal. No respiratory distress.     Breath sounds: Normal breath sounds.  Abdominal:     General: Bowel sounds are normal.     Palpations: Abdomen is soft.     Tenderness: There is no abdominal tenderness.  Musculoskeletal:        General: No swelling.     Cervical back: Normal range of motion.  Lymphadenopathy:     Cervical: No cervical adenopathy.  Skin:    General: Skin is warm and dry.     Capillary Refill: Capillary refill takes less than 2 seconds.     Comments: Healing paronychia to the lateral aspect of the right ring finger.  Neurological:     General: No focal deficit present.     Mental Status: He is alert and oriented to person, place, and time.  Psychiatric:        Mood and Affect: Mood normal.        Behavior: Behavior normal.      UC Treatments / Results  Labs (all labs ordered are listed, but only abnormal results are displayed) Labs Reviewed - No data to display  EKG   Radiology No results found.  Procedures Procedures (including critical care time)  Medications Ordered in UC Medications - No data to display  Initial Impression / Assessment and Plan / UC Course  I have reviewed the triage vital signs and the nursing notes.  Pertinent labs & imaging results that were available during my care of the patient were reviewed by me and considered in my medical decision making (see chart for details).  Patient presents with paronychia to the right ring finger.  Right finger finger appears to be in the healing  stages at this time.  We will treat patient with Bactrim DS for 7  days.  Supportive care recommendations were provided to the patient with strict indications of when to return.  Patient verbalizes understanding.  All questions were answered. Final Clinical Impressions(s) / UC Diagnoses   Final diagnoses:  Paronychia of finger of right hand     Discharge Instructions      Take medication as prescribed. May take over-the-counter Tylenol or ibuprofen as needed for pain or discomfort. Recommend Epsom salt soaks 2 times daily while symptoms persist. Keep the area wrapped to prevent further irritation of symptoms. Follow-up immediately if you develop worsening swelling, pain, foul-smelling drainage, or if you develop fever, chills, or other concerns.     ED Prescriptions     Medication Sig Dispense Auth. Provider   sulfamethoxazole-trimethoprim (BACTRIM DS) 800-160 MG tablet Take 1 tablet by mouth 2 (two) times daily for 7 days. 14 tablet Marin Milley-Warren, Alda Lea, NP      PDMP not reviewed this encounter.   Tish Men, NP 10/26/21 1008

## 2021-10-26 NOTE — Telephone Encounter (Signed)
Received approval letter from [facility] for Humira43m/0.8ml. Sent copy to scan center.

## 2021-10-26 NOTE — Discharge Instructions (Addendum)
Take medication as prescribed. May take over-the-counter Tylenol or ibuprofen as needed for pain or discomfort. Recommend Epsom salt soaks 2 times daily while symptoms persist. Keep the area wrapped to prevent further irritation of symptoms. Follow-up immediately if you develop worsening swelling, pain, foul-smelling drainage, or if you develop fever, chills, or other concerns.

## 2021-11-03 ENCOUNTER — Telehealth: Payer: Self-pay | Admitting: *Deleted

## 2021-11-03 ENCOUNTER — Other Ambulatory Visit: Payer: Self-pay | Admitting: *Deleted

## 2021-11-03 DIAGNOSIS — K611 Rectal abscess: Secondary | ICD-10-CM

## 2021-11-03 DIAGNOSIS — K51911 Ulcerative colitis, unspecified with rectal bleeding: Secondary | ICD-10-CM

## 2021-11-03 NOTE — Telephone Encounter (Signed)
Referral sent 

## 2021-11-03 NOTE — Telephone Encounter (Signed)
Agree with these recommendations. He needs to call general surgery ASAP for evaluation and management of the abscesses. May need drainage, antibiotics, and definitely needs seton placement ASAP due to fistula. They should at least be able to get him in the office to reassess him and hopefully they will be able to move his procedure up as well.   We will continue with his current medications. If he has any worsening of the abscesses over the weekend, develops fever, has any severe rectal bleeding, proceed to the emergency room. Encouragingly his hemoglobin has remained within normal limits despite the rectal bleeding he has been experiencing.   I will plan to discuss his case again with Dr. Gala Romney next week when he returns to see if he has any other recommendations.  I would like to go ahead and place a referral to the IBD clinic at [facility] as we previously discussed. I will have [name]/Tammy take care of this.   [name]/Tammy:  Please place referral to [facility] IBD clinic ASAP. Dx: Previously diagnosed with UC, but now with perirectal abscess with fistula, suspect Crohn's disease.

## 2021-11-03 NOTE — Telephone Encounter (Signed)
Spoke to pt, he informed me that he has a couple abscess. He states he has been putting hot compresses on them and that seems to be helping some. He states they are not as painful and they were. Also states his Humira did come last week. He has been having bloody stools. He states the last couple days seems like more blood then usual. Informed him to call the surgical center to see if they could get him in any sooner than 9/22. He voiced understanding.

## 2021-11-05 DIAGNOSIS — S4992XA Unspecified injury of left shoulder and upper arm, initial encounter: Secondary | ICD-10-CM | POA: Diagnosis not present

## 2021-11-05 DIAGNOSIS — K5190 Ulcerative colitis, unspecified, without complications: Secondary | ICD-10-CM | POA: Diagnosis not present

## 2021-11-05 DIAGNOSIS — S199XXA Unspecified injury of neck, initial encounter: Secondary | ICD-10-CM | POA: Diagnosis not present

## 2021-11-05 DIAGNOSIS — D72829 Elevated white blood cell count, unspecified: Secondary | ICD-10-CM | POA: Diagnosis not present

## 2021-11-05 DIAGNOSIS — S299XXA Unspecified injury of thorax, initial encounter: Secondary | ICD-10-CM | POA: Diagnosis not present

## 2021-11-05 DIAGNOSIS — R410 Disorientation, unspecified: Secondary | ICD-10-CM | POA: Diagnosis not present

## 2021-11-05 DIAGNOSIS — S0990XA Unspecified injury of head, initial encounter: Secondary | ICD-10-CM | POA: Diagnosis not present

## 2021-11-05 DIAGNOSIS — R58 Hemorrhage, not elsewhere classified: Secondary | ICD-10-CM | POA: Diagnosis not present

## 2021-11-05 DIAGNOSIS — R55 Syncope and collapse: Secondary | ICD-10-CM | POA: Diagnosis not present

## 2021-11-05 DIAGNOSIS — S062XAA Diffuse traumatic brain injury with loss of consciousness status unknown, initial encounter: Secondary | ICD-10-CM | POA: Diagnosis not present

## 2021-11-05 DIAGNOSIS — S0101XA Laceration without foreign body of scalp, initial encounter: Secondary | ICD-10-CM | POA: Diagnosis not present

## 2021-11-05 DIAGNOSIS — S01312A Laceration without foreign body of left ear, initial encounter: Secondary | ICD-10-CM | POA: Diagnosis not present

## 2021-11-05 DIAGNOSIS — S61011A Laceration without foreign body of right thumb without damage to nail, initial encounter: Secondary | ICD-10-CM | POA: Diagnosis not present

## 2021-11-05 DIAGNOSIS — R52 Pain, unspecified: Secondary | ICD-10-CM | POA: Diagnosis not present

## 2021-11-05 DIAGNOSIS — S161XXA Strain of muscle, fascia and tendon at neck level, initial encounter: Secondary | ICD-10-CM | POA: Diagnosis not present

## 2021-11-05 DIAGNOSIS — Z23 Encounter for immunization: Secondary | ICD-10-CM | POA: Diagnosis not present

## 2021-11-05 DIAGNOSIS — S46912A Strain of unspecified muscle, fascia and tendon at shoulder and upper arm level, left arm, initial encounter: Secondary | ICD-10-CM | POA: Diagnosis not present

## 2021-11-05 DIAGNOSIS — R21 Rash and other nonspecific skin eruption: Secondary | ICD-10-CM | POA: Diagnosis not present

## 2021-11-05 DIAGNOSIS — S06329A Contusion and laceration of left cerebrum with loss of consciousness of unspecified duration, initial encounter: Secondary | ICD-10-CM | POA: Diagnosis not present

## 2021-11-05 DIAGNOSIS — Y92410 Unspecified street and highway as the place of occurrence of the external cause: Secondary | ICD-10-CM | POA: Diagnosis not present

## 2021-11-05 DIAGNOSIS — S06322A Contusion and laceration of left cerebrum with loss of consciousness of 31 minutes to 59 minutes, initial encounter: Secondary | ICD-10-CM | POA: Diagnosis not present

## 2021-11-05 DIAGNOSIS — S3992XA Unspecified injury of lower back, initial encounter: Secondary | ICD-10-CM | POA: Diagnosis not present

## 2021-11-05 DIAGNOSIS — Y9351 Activity, roller skating (inline) and skateboarding: Secondary | ICD-10-CM | POA: Diagnosis not present

## 2021-11-06 DIAGNOSIS — S61011A Laceration without foreign body of right thumb without damage to nail, initial encounter: Secondary | ICD-10-CM | POA: Diagnosis not present

## 2021-11-06 DIAGNOSIS — S0632AA Contusion and laceration of left cerebrum with loss of consciousness status unknown, initial encounter: Secondary | ICD-10-CM | POA: Diagnosis not present

## 2021-11-06 DIAGNOSIS — S01312A Laceration without foreign body of left ear, initial encounter: Secondary | ICD-10-CM | POA: Diagnosis not present

## 2021-11-06 DIAGNOSIS — R42 Dizziness and giddiness: Secondary | ICD-10-CM | POA: Diagnosis not present

## 2021-11-06 DIAGNOSIS — S0990XA Unspecified injury of head, initial encounter: Secondary | ICD-10-CM | POA: Diagnosis not present

## 2021-11-06 DIAGNOSIS — S069X9A Unspecified intracranial injury with loss of consciousness of unspecified duration, initial encounter: Secondary | ICD-10-CM | POA: Diagnosis not present

## 2021-11-06 DIAGNOSIS — R519 Headache, unspecified: Secondary | ICD-10-CM | POA: Diagnosis not present

## 2021-11-07 NOTE — Telephone Encounter (Signed)
Tried calling patient today to see how he was doing and if he had been able to reach [facility]. [facility] requesting return call.

## 2021-11-09 ENCOUNTER — Encounter (HOSPITAL_COMMUNITY): Payer: Self-pay | Admitting: Anesthesiology
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
## 2021-11-10 NOTE — Telephone Encounter (Signed)
Spoke to pt, informed him of recommendations. Pt voiced understanding. Informed to call if he has any problems.

## 2021-11-14 DIAGNOSIS — Z6823 Body mass index (BMI) 23.0-23.9, adult: Secondary | ICD-10-CM | POA: Diagnosis not present

## 2021-11-14 DIAGNOSIS — S060XAA Concussion with loss of consciousness status unknown, initial encounter: Secondary | ICD-10-CM | POA: Diagnosis not present

## 2021-11-16 DIAGNOSIS — R42 Dizziness and giddiness: Secondary | ICD-10-CM | POA: Diagnosis not present

## 2021-11-16 DIAGNOSIS — R519 Headache, unspecified: Secondary | ICD-10-CM | POA: Diagnosis not present

## 2021-11-16 DIAGNOSIS — S060X0A Concussion without loss of consciousness, initial encounter: Secondary | ICD-10-CM | POA: Diagnosis not present

## 2021-11-16 DIAGNOSIS — H538 Other visual disturbances: Secondary | ICD-10-CM | POA: Diagnosis not present

## 2021-11-22 DIAGNOSIS — M546 Pain in thoracic spine: Secondary | ICD-10-CM | POA: Diagnosis not present

## 2021-11-22 DIAGNOSIS — M9901 Segmental and somatic dysfunction of cervical region: Secondary | ICD-10-CM | POA: Diagnosis not present

## 2021-11-22 DIAGNOSIS — M542 Cervicalgia: Secondary | ICD-10-CM | POA: Diagnosis not present

## 2021-11-22 DIAGNOSIS — M9902 Segmental and somatic dysfunction of thoracic region: Secondary | ICD-10-CM | POA: Diagnosis not present

## 2021-11-24 ENCOUNTER — Ambulatory Visit (HOSPITAL_BASED_OUTPATIENT_CLINIC_OR_DEPARTMENT_OTHER): Admission: RE | Admit: 2021-11-24 | Payer: BC Managed Care – PPO | Source: Home / Self Care | Admitting: General Surgery

## 2021-11-24 SURGERY — EXAM UNDER ANESTHESIA
Anesthesia: Monitor Anesthesia Care

## 2021-12-02 ENCOUNTER — Ambulatory Visit
Admission: EM | Admit: 2021-12-02 | Discharge: 2021-12-02 | Disposition: A | Discharge status: Home / Self Care | Payer: BC Managed Care – PPO | Attending: Family Medicine | Admitting: Family Medicine

## 2021-12-02 DIAGNOSIS — M25531 Pain in right wrist: Secondary | ICD-10-CM | POA: Diagnosis not present

## 2021-12-02 DIAGNOSIS — M25532 Pain in left wrist: Secondary | ICD-10-CM | POA: Diagnosis not present

## 2021-12-02 MED ORDER — PREDNISONE 20 MG PO TABS: 40.0000 mg | ORAL_TABLET | Freq: Every day | ORAL | 0 refills | Status: DC
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
## 2021-12-05 ENCOUNTER — Telehealth: Payer: Self-pay | Admitting: *Deleted
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
## 2021-12-06 ENCOUNTER — Other Ambulatory Visit: Payer: Self-pay | Admitting: Gastroenterology

## 2021-12-06 DIAGNOSIS — K51911 Ulcerative colitis, unspecified with rectal bleeding: Secondary | ICD-10-CM

## 2021-12-06 DIAGNOSIS — M25539 Pain in unspecified wrist: Secondary | ICD-10-CM | POA: Diagnosis not present

## 2021-12-06 DIAGNOSIS — Z6823 Body mass index (BMI) 23.0-23.9, adult: Secondary | ICD-10-CM | POA: Diagnosis not present

## 2021-12-06 MED ORDER — PREDNISONE 10 MG PO TABS: 30.0000 mg | ORAL_TABLET | Freq: Every day | ORAL | 0 refills | Status: DC

## 2021-12-06 NOTE — Telephone Encounter (Signed)
Spoke to pt, informed him of recommendations. Pt voiced understanding. Informed to start taking Humira again and to start new prescription of prednisone. Informed to reach out to neurologist for appointment to be cleared for surgery.

## 2021-12-06 NOTE — Telephone Encounter (Signed)
Spoke to pt, he informed me that he is still having some GI problems, but they were not as bad as before. He states he has had some rectal bleeding. His stools are not normal. He states they are not diarrhea and he's not constipated. It's in between. He has stop taking Humira, because his PCP told him that it weakened his immune system and he had 4 abscesses in month. Today was the last day of Prednisone 40 mg. He has not heard from the neurologist and did not reach out to them, because he went to his PCP and had a CT scan at [facility] and was told everything looked fine. Also has not heard anything from [facility].

## 2021-12-06 NOTE — Telephone Encounter (Signed)
He needs to resume Humira weekly.  We need to taper down prednisone. Start 30 mg daily. I will send in a new Rx.   He needs to call the neurologist to schedule an appointment so he can have clearance for surgery. If he doesn't have their information, he can reach out to [facility] for this as they placed the referral.   We need to obtain CT report from [facility]. The only CT I see on file from [facility] is of his head, nothing of his abdomen.   [name]: Can we see what is going on with the referral to [place]: We need to get patient scheduled with Dr. Gala Romney in 2 weeks. We also need CT report from [facility].

## 2021-12-06 NOTE — Telephone Encounter (Signed)
NotedLoma Mcdowell, see [name]'s note. Let him know [name] has tried reaching out to him, but couldn't reach him. He needs to call [phone] to schedule an appointment.

## 2021-12-06 NOTE — Telephone Encounter (Signed)
Spoke with [facility] and was advised they contacted pt on 9/18 and 9/28. No response. If he still needs an appt pt can call [phone]
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
## 2021-12-07 NOTE — Telephone Encounter (Signed)
Noted. Informed him to call.

## 2021-12-19 ENCOUNTER — Encounter: Payer: Self-pay | Admitting: *Deleted

## 2021-12-20 ENCOUNTER — Ambulatory Visit: Payer: BC Managed Care – PPO | Admitting: Psychiatry
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
## 2021-12-21 ENCOUNTER — Telehealth: Payer: Self-pay | Admitting: *Deleted

## 2021-12-21 ENCOUNTER — Ambulatory Visit (INDEPENDENT_AMBULATORY_CARE_PROVIDER_SITE_OTHER): Payer: BC Managed Care – PPO | Admitting: Internal Medicine

## 2021-12-21 ENCOUNTER — Encounter: Payer: Self-pay | Admitting: Internal Medicine

## 2021-12-21 VITALS — BP 136/76 | HR 65 | Temp 98.2°F | Ht 67.0 in | Wt 154.2 lb

## 2021-12-21 DIAGNOSIS — K6289 Other specified diseases of anus and rectum: Secondary | ICD-10-CM

## 2021-12-21 DIAGNOSIS — K51911 Ulcerative colitis, unspecified with rectal bleeding: Secondary | ICD-10-CM | POA: Diagnosis not present

## 2021-12-21 DIAGNOSIS — K5900 Constipation, unspecified: Secondary | ICD-10-CM

## 2021-12-21 DIAGNOSIS — K611 Rectal abscess: Secondary | ICD-10-CM

## 2021-12-21 NOTE — Patient Instructions (Signed)
It was good to see you again today!  As discussed, immediately decrease your prednisone to 20 mg daily for the next 30 days; then we will decrease to 10 mg daily for another 30 days so long as you continue to do well  For now, continue Humira 40 mg weekly  As discussed, you have inflammatory bowel disease.  It is possible you may have more of a Crohn's colitis rather than an ulcerative colitis.  Ultimately, would like you to be seen by the inflammatory bowel disease clinic at [facility] to help give [place] perspective as to which way to go as far as treatment is concerned moving forward (if they feel it is warranted, you may have your next colonoscopy by them so they can better provide informative recommendations)  I am glad you are planning to follow through seeing Dr. Marcello Moores for management of fistula via seton placement  We will go to bat for you and facilitate appointments with [facility] surgery and [facility].  Give [place] 2 days to get you an update.  Further recommendations to follow.

## 2021-12-21 NOTE — Telephone Encounter (Signed)
Pt informed of appt at [facility] in [place] office with Dr. Arnell Sieving on 04/04/22 at 11 am, new pt packet will be mailed to pt and he will also be put on a waiting list.  Appt for [facility] is for 01/22/22, arrive at 3:05 pm to check in.

## 2021-12-21 NOTE — Progress Notes (Signed)
Primary Care Physician:  Caryl Bis, MD Primary Gastroenterologist:  Dr.   Pre-Procedure History & Physical: HPI:  Michael Mcdowell is a 31 y.o. male with a history of inflammatory bowel disease which historically has appeared to be more of an ulcerative colitis phenotype with his initial colonoscopy 2 years ago.  He went from prednisone/mesalamine onto Humira due to progression of symptoms.  Clinical course complicated by perirectal abscess for which he saw surgeon who performed drainage when he was on a trip out in [place].  He received antibiotics.  Humira refill he held earlier this year.  When he was on bimonthly regimen, he was found to have no antibodies but a low drug level.  Subsequently, back on Humira with decreasing interval between administration (40 mg once weekly). 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  States being under a lot of stress.  We have had some difficulties in communicating with him regarding appointments and recommendations along the way.  Given history of perirectal abscess/rectal pain he underwent an MRI of his pelvis back in August of this year which revealed an intersphincteric perianal fistula arising from the posterior wall at the 6 o'clock position with evidence of very small (less than 1 cm abscess).  Patient saw Dr. Leighton Ruff.  [place]on placement has been planned but has not been accomplished as of yet. Patient states he has committed to having this done but has not heard back from [place] surgery.  In parallel, we have recommended he see the IBD clinic at [facility] to reassess his inflammatory bowel disease help in medical management for the long term.    Previously on NSAIDs  - no more.  He developed arthralgias/arthritis in his right wrist for which she was started on prednisone at [facility] recently.  He had an accident on his skateboard -fail, had a concussion.  It was unclear whether or not he had a wrist  injury from that or he is got IBD arthropathy.  He is now down to prednisone 30 mg daily.  He feels that his IBD symptoms are improved.  In fact, more recently he has been constipated.  Taking MiraLAX and drinking coffee to facilitate bowel function which she describes as 1-3 BMs daily without diarrhea or drinking coffee which tends to produce anywhere from 1-3 bowel movements daily.  He does not endorse occasional scant rectal bleeding on the toilet tissue.  Minimal abdominal pain.  He continues to be able to eat very well.  No nausea or vomiting.  Past Medical History:  Diagnosis Date   Substance abuse in remission [facility])    Ulcerative colitis ([place]) 06/2019    Past Surgical History:  Procedure Laterality Date   BIOPSY  06/08/2019   Procedure: BIOPSY;  Surgeon: Daneil Dolin, MD;  [facility];  Service: Endoscopy;;   COLONOSCOPY WITH PROPOFOL N/A 06/08/2019   Inflamed rectum and left colon likely representing UC in evolution. Cecal: midly active colitis, de=scending colon with moderately active colitis, sigmoid with moderately active colitis, rectum with moderately active colitis   Tyhroglossal duct cyst      Prior to Admission medications   Medication Sig Start Date End Date Taking? Authorizing Provider  acetaminophen (TYLENOL) 500 MG tablet Take 500-1,000 mg by mouth every 6 (six) hours as needed (pain.).   Yes [provider]  Adalimumab (HUMIRA) 40 MG/0.8ML PSKT INJECT 40 MG UNDER THE SKIN EVERY 7 DAYS Patient taking differently:  Take 40 mg by mouth every 14 (fourteen) days. 09/28/21  Yes Erenest Rasher, PA-C  predniSONE (DELTASONE) 10 MG tablet Take 3 tablets (30 mg total) by mouth daily. 12/06/21  Yes Aliene Altes S, PA-C    Allergies as of 12/21/2021 - Review Complete 12/21/2021  Allergen Reaction Noted   Vibramycin [doxycycline] Other (See Comments) 08/31/2014    Family History  Problem Relation Age of Onset   Healthy Mother    Healthy Father     Cancer Paternal Grandmother    Colon cancer Neg Hx    Colon polyps Neg Hx    Inflammatory bowel disease Neg Hx     Social History   Socioeconomic History   Marital status: Single    Spouse name: Not on file   Number of children: Not on file   Years of education: Not on file   Highest education level: Not on file  Occupational History   Not on file  Tobacco Use   Smoking status: Former    Types: Cigarettes   Smokeless tobacco: Former    Types: Chew   Tobacco comments:    no cigarettes in 4 years,   Vaping Use   Vaping Use: Never used  Substance and Sexual Activity   Alcohol use: Yes    Comment: rare   Drug use: Not Currently    Comment: pain pills 5 years ago, none since then.    Sexual activity: Yes  Other Topics Concern   Not on file  Social History Narrative   Not on file   Social Determinants of Health   Financial Resource Strain: Not on file  Food Insecurity: Not on file  Transportation Needs: Not on file  Physical Activity: Not on file  Stress: Not on file  Social Connections: Not on file  Intimate Partner Violence: Not on file    Review of Systems: See HPI, otherwise negative ROS  Physical Exam: BP 136/76 (BP Location: Right Arm, Patient Position: Sitting, Cuff Size: Normal)   Pulse 65   Temp 98.2 F (36.8 C) (Oral)   Ht 5' 7"  (1.702 m)   Wt 154 lb 3.2 oz (69.9 kg)   SpO2 98%   BMI 24.15 kg/m  General:   Alert,  Well-developed, well-nourished, pleasant and cooperative in NAD Mouth:  No deformity or lesions. Neck:  Supple; no masses or thyromegaly. No significant cervical adenopathy. Lungs:  Clear throughout to auscultation.   No wheezes, crackles, or rhonchi. No acute distress. Heart:  Regular rate and rhythm; no murmurs, clicks, rubs,  or gallops. Abdomen: Non-distended, normal bowel sounds.  Soft and nontender without appreciable mass or hepatosplenomegaly.  Pulses:  Normal pulses noted. Extremities:  Without clubbing or edema. Rectal: No  external lesions.  Exam limited to discomfort and narrow anal canal.  No obvious fistula tract opening seen.  Again, exam limited.  Certainly, no obvious perirectal abscess.  Impression: Very pleasant 31 year old gentleman with a history of patchy pan proctocolitis at colonoscopy 2 years ago.  Rectum very much involved.  Bout of perirectal abscess requiring [place] and [place] earlier this year.  Abdominal pain led to an pelvic MRI which demonstrated intersphincteric fistula.  This was confirmed by Dr. Marcello Moores. Seeing him today, he looks clinically well.  His baseline bowel function is now that of constipation.  Recent systemic corticosteroid therapy added to his regimen.  I am really not sure if wrist arthralgias IBD related or not. Not only is it important for him to follow-up with  Dr. Marcello Moores, I feel that it would be worthwhile for the patient to be evaluated in the IBD clinic at [place]. Given perirectal abscess and fistula formation noted this year, we could be dealing with Crohn's colitis. Did have a discussion with the patient regarding challenges in communication.  At times, it has been unusually difficult for this office to communicate with the patient during the past several months.  I emphasized the importance of good 2-way dialogue in the interest of best patient care and a productive doctor-patient relationship.   Recommendations:  For now, continue Humira 40 mg weekly  As discussed,  inflammatory bowel disease is present. May have more of a Crohn's colitis rather than an ulcerative colitis.  Ultimately,  referral to the IBD clinic at [facility] recommended to help /provide guidance as to which way to go as far as long-term Conservation officer, nature.  Glad to hear is seeing seeing Dr. Marcello Moores for management of fistula as deemed appropriate.  We will go to bat for and facilitate appointments with [place] [facility] and [facility].  Give [place] 2 days to provide an  update on scheduling.  Further recommendations to follow.     Notice: This dictation was prepared with Dragon dictation along with smaller phrase technology. Any transcriptional errors that result from this process are unintentional and may not be corrected upon review.

## 2022-01-17 ENCOUNTER — Other Ambulatory Visit (HOSPITAL_COMMUNITY): Payer: Self-pay

## 2022-01-17 ENCOUNTER — Other Ambulatory Visit: Payer: Self-pay

## 2022-01-17 DIAGNOSIS — K51911 Ulcerative colitis, unspecified with rectal bleeding: Secondary | ICD-10-CM

## 2022-01-17 DIAGNOSIS — M25531 Pain in right wrist: Secondary | ICD-10-CM | POA: Diagnosis not present

## 2022-01-17 DIAGNOSIS — M25432 Effusion, left wrist: Secondary | ICD-10-CM | POA: Diagnosis not present

## 2022-01-17 DIAGNOSIS — M25532 Pain in left wrist: Secondary | ICD-10-CM | POA: Diagnosis not present

## 2022-01-17 DIAGNOSIS — M25431 Effusion, right wrist: Secondary | ICD-10-CM | POA: Diagnosis not present

## 2022-01-17 DIAGNOSIS — R5383 Other fatigue: Secondary | ICD-10-CM | POA: Diagnosis not present

## 2022-01-17 DIAGNOSIS — E559 Vitamin D deficiency, unspecified: Secondary | ICD-10-CM | POA: Diagnosis not present

## 2022-01-17 MED ORDER — HUMIRA 40 MG/0.8ML ~~LOC~~ PSKT
PREFILLED_SYRINGE | SUBCUTANEOUS | 5 refills | Status: DC
  Filled 2022-01-17: qty 4, fill #0
  Filled 2022-01-22: qty 4, 28d supply, fill #0
  Filled 2022-02-22: qty 4, 28d supply, fill #1

## 2022-01-22 ENCOUNTER — Other Ambulatory Visit (HOSPITAL_COMMUNITY): Payer: Self-pay

## 2022-01-22 DIAGNOSIS — K603 Anal fistula: Secondary | ICD-10-CM | POA: Diagnosis not present

## 2022-01-23 ENCOUNTER — Other Ambulatory Visit (HOSPITAL_COMMUNITY): Payer: Self-pay

## 2022-01-23 DIAGNOSIS — M25531 Pain in right wrist: Secondary | ICD-10-CM | POA: Diagnosis not present

## 2022-01-23 DIAGNOSIS — M659 Synovitis and tenosynovitis, unspecified: Secondary | ICD-10-CM | POA: Diagnosis not present

## 2022-01-30 ENCOUNTER — Other Ambulatory Visit (HOSPITAL_COMMUNITY): Payer: Self-pay

## 2022-02-05 DIAGNOSIS — M25531 Pain in right wrist: Secondary | ICD-10-CM | POA: Diagnosis not present

## 2022-02-20 ENCOUNTER — Other Ambulatory Visit (HOSPITAL_COMMUNITY): Payer: Self-pay

## 2022-02-22 ENCOUNTER — Other Ambulatory Visit: Payer: Self-pay

## 2022-02-27 ENCOUNTER — Other Ambulatory Visit: Payer: Self-pay

## 2022-03-06 DIAGNOSIS — M25531 Pain in right wrist: Secondary | ICD-10-CM | POA: Diagnosis not present

## 2022-03-16 ENCOUNTER — Other Ambulatory Visit (HOSPITAL_COMMUNITY): Payer: Self-pay

## 2022-03-20 ENCOUNTER — Other Ambulatory Visit (HOSPITAL_COMMUNITY): Payer: Self-pay

## 2022-03-20 MED ORDER — HUMIRA (2 SYRINGE) 40 MG/0.8ML ~~LOC~~ PSKT
PREFILLED_SYRINGE | SUBCUTANEOUS | 3 refills | Status: DC
  Filled 2022-03-20: qty 4, 28d supply, fill #0

## 2022-03-21 DIAGNOSIS — M2540 Effusion, unspecified joint: Secondary | ICD-10-CM | POA: Diagnosis not present

## 2022-03-21 DIAGNOSIS — K51919 Ulcerative colitis, unspecified with unspecified complications: Secondary | ICD-10-CM | POA: Diagnosis not present

## 2022-03-21 DIAGNOSIS — M2560 Stiffness of unspecified joint, not elsewhere classified: Secondary | ICD-10-CM | POA: Diagnosis not present

## 2022-03-21 DIAGNOSIS — M79642 Pain in left hand: Secondary | ICD-10-CM | POA: Diagnosis not present

## 2022-03-21 DIAGNOSIS — M79641 Pain in right hand: Secondary | ICD-10-CM | POA: Diagnosis not present

## 2022-03-26 ENCOUNTER — Other Ambulatory Visit: Payer: Self-pay

## 2022-03-26 ENCOUNTER — Other Ambulatory Visit (HOSPITAL_COMMUNITY): Payer: Self-pay

## 2022-03-27 ENCOUNTER — Other Ambulatory Visit (HOSPITAL_COMMUNITY): Payer: Self-pay

## 2022-03-28 ENCOUNTER — Other Ambulatory Visit (HOSPITAL_COMMUNITY): Payer: Self-pay

## 2022-03-30 ENCOUNTER — Other Ambulatory Visit (HOSPITAL_COMMUNITY): Payer: Self-pay

## 2022-04-03 ENCOUNTER — Other Ambulatory Visit (HOSPITAL_COMMUNITY): Payer: Self-pay

## 2022-04-04 ENCOUNTER — Other Ambulatory Visit (HOSPITAL_COMMUNITY): Payer: Self-pay

## 2022-04-04 ENCOUNTER — Other Ambulatory Visit: Payer: Self-pay

## 2022-04-10 DIAGNOSIS — M79641 Pain in right hand: Secondary | ICD-10-CM | POA: Diagnosis not present

## 2022-04-10 DIAGNOSIS — K529 Noninfective gastroenteritis and colitis, unspecified: Secondary | ICD-10-CM | POA: Diagnosis not present

## 2022-04-10 DIAGNOSIS — M064 Inflammatory polyarthropathy: Secondary | ICD-10-CM | POA: Diagnosis not present

## 2022-04-10 DIAGNOSIS — K51919 Ulcerative colitis, unspecified with unspecified complications: Secondary | ICD-10-CM | POA: Diagnosis not present

## 2022-04-24 ENCOUNTER — Other Ambulatory Visit (HOSPITAL_COMMUNITY): Payer: Self-pay

## 2022-04-25 DIAGNOSIS — M25641 Stiffness of right hand, not elsewhere classified: Secondary | ICD-10-CM | POA: Diagnosis not present

## 2022-04-25 DIAGNOSIS — M79641 Pain in right hand: Secondary | ICD-10-CM | POA: Diagnosis not present

## 2022-04-25 DIAGNOSIS — R531 Weakness: Secondary | ICD-10-CM | POA: Diagnosis not present

## 2022-04-25 DIAGNOSIS — M25531 Pain in right wrist: Secondary | ICD-10-CM | POA: Diagnosis not present

## 2022-04-26 ENCOUNTER — Other Ambulatory Visit (HOSPITAL_COMMUNITY): Payer: Self-pay

## 2022-04-26 DIAGNOSIS — M25531 Pain in right wrist: Secondary | ICD-10-CM | POA: Diagnosis not present

## 2022-04-26 DIAGNOSIS — M25641 Stiffness of right hand, not elsewhere classified: Secondary | ICD-10-CM | POA: Diagnosis not present

## 2022-04-26 DIAGNOSIS — R531 Weakness: Secondary | ICD-10-CM | POA: Diagnosis not present

## 2022-04-26 DIAGNOSIS — M79641 Pain in right hand: Secondary | ICD-10-CM | POA: Diagnosis not present

## 2022-04-30 ENCOUNTER — Other Ambulatory Visit (HOSPITAL_COMMUNITY): Payer: Self-pay

## 2022-05-03 ENCOUNTER — Other Ambulatory Visit (HOSPITAL_COMMUNITY): Payer: Self-pay

## 2022-05-07 DIAGNOSIS — M79644 Pain in right finger(s): Secondary | ICD-10-CM | POA: Diagnosis not present

## 2022-05-07 DIAGNOSIS — M25531 Pain in right wrist: Secondary | ICD-10-CM | POA: Diagnosis not present

## 2022-05-14 DIAGNOSIS — M79644 Pain in right finger(s): Secondary | ICD-10-CM | POA: Diagnosis not present

## 2022-05-14 DIAGNOSIS — M25531 Pain in right wrist: Secondary | ICD-10-CM | POA: Diagnosis not present

## 2022-05-21 DIAGNOSIS — S5331XA Traumatic rupture of right ulnar collateral ligament, initial encounter: Secondary | ICD-10-CM | POA: Diagnosis not present

## 2022-05-21 DIAGNOSIS — S60211A Contusion of right wrist, initial encounter: Secondary | ICD-10-CM | POA: Diagnosis not present

## 2022-05-25 ENCOUNTER — Other Ambulatory Visit (HOSPITAL_COMMUNITY): Payer: Self-pay

## 2022-09-28 ENCOUNTER — Encounter: Payer: Self-pay | Admitting: Emergency Medicine

## 2022-09-28 ENCOUNTER — Ambulatory Visit
Admission: EM | Admit: 2022-09-28 | Discharge: 2022-09-28 | Disposition: A | Discharge status: Home / Self Care | Payer: BC Managed Care – PPO

## 2022-09-28 DIAGNOSIS — J069 Acute upper respiratory infection, unspecified: Secondary | ICD-10-CM

## 2022-09-28 NOTE — Discharge Instructions (Signed)
You may try DayQuil, NyQuil, Flonase nasal spray twice daily, sinus rinses with warm saline, humidifiers, vapor rubs or essential oils.  Symptoms typically last between 5 and 7 days, if your symptoms are lasting more than 10 days follow-up for a recheck

## 2022-09-28 NOTE — ED Provider Notes (Signed)
[facility]    CSN: [id number] Arrival date & time: 09/28/22  1247      History   Chief Complaint No chief complaint on file.   HPI Michael Mcdowell is a 32 y.o. male.   Presenting today with 1 day history of nasal congestion, sneezing, scratchy throat.  Denies fever, chills, cough, chest pain, shortness of breath, abdominal pain, nausea vomiting or diarrhea.  So far not tried anything over-the-counter for symptoms apart from took Mucinex about 2 hours ago.  No known history of pertinent chronic medical problems.    Past Medical History:  Diagnosis Date   Substance abuse in remission [facility])    Ulcerative colitis ([facility]) 06/2019    Patient Active Problem List   Diagnosis Date Noted   Constipation 09/28/2021   Perirectal abscess 09/28/2021   Diarrhea 08/19/2019   Ulcerative colitis with rectal bleeding ([facility]) 07/10/2019   Rectal bleeding 05/20/2019   Colitis 05/20/2019   KNEE PAIN, RIGHT 10/15/2007   TALIPES CAVUS 10/15/2007    Past Surgical History:  Procedure Laterality Date   BIOPSY  06/08/2019   Procedure: BIOPSY;  Surgeon: Corbin Ade, MD;  [facility];  Service: Endoscopy;;   COLONOSCOPY WITH PROPOFOL N/A 06/08/2019   Inflamed rectum and left colon likely representing UC in evolution. Cecal: midly active colitis, de=scending colon with moderately active colitis, sigmoid with moderately active colitis, rectum with moderately active colitis   Tyhroglossal duct cyst         Home Medications    Prior to Admission medications   Medication Sig Start Date End Date Taking? Authorizing Provider  acetaminophen (TYLENOL) 500 MG tablet Take 500-1,000 mg by mouth every 6 (six) hours as needed (pain.).    [provider]    Family History Family History  Problem Relation Age of Onset   Healthy Mother    Healthy Father    Cancer Paternal Grandmother    Colon cancer Neg Hx    Colon polyps Neg Hx    Inflammatory bowel disease Neg Hx      Social History Social History   Tobacco Use   Smoking status: Former    Types: Cigarettes   Smokeless tobacco: Former    Types: Chew   Tobacco comments:    no cigarettes in 4 years,   Vaping Use   Vaping status: Never Used  Substance Use Topics   Alcohol use: Yes    Comment: rare   Drug use: Not Currently    Comment: pain pills 5 years ago, none since then.      Allergies   Vibramycin [doxycycline]   Review of Systems Review of Systems PER HPI  Physical Exam Triage Vital Signs ED Triage Vitals  Encounter Vitals Group     BP 09/28/22 1259 (!) 145/84     Systolic BP Percentile --      Diastolic BP Percentile --      Pulse Rate 09/28/22 1259 77     Resp 09/28/22 1259 18     Temp 09/28/22 1259 98.1 F (36.7 C)     Temp Source 09/28/22 1259 Oral     SpO2 09/28/22 1259 98 %     Weight --      Height --      Head Circumference --      Peak Flow --      Pain Score 09/28/22 1300 0     Pain Loc --      Pain Education --  Exclude from Growth Chart --    No data found.  Updated Vital Signs BP (!) 145/84 (BP Location: Right Arm)   Pulse 77   Temp 98.1 F (36.7 C) (Oral)   Resp 18   SpO2 98%   Visual Acuity Right Eye Distance:   Left Eye Distance:   Bilateral Distance:    Right Eye Near:   Left Eye Near:    Bilateral Near:     Physical Exam Vitals and nursing note reviewed.  Constitutional:      Appearance: He is well-developed.  HENT:     Head: Atraumatic.     Right Ear: External ear normal.     Left Ear: External ear normal.     Nose: Rhinorrhea present.     Mouth/Throat:     Pharynx: Posterior oropharyngeal erythema present. No oropharyngeal exudate.  Eyes:     Conjunctiva/sclera: Conjunctivae normal.     Pupils: Pupils are equal, round, and reactive to light.  Cardiovascular:     Rate and Rhythm: Normal rate and regular rhythm.  Pulmonary:     Effort: Pulmonary effort is normal. No respiratory distress.     Breath sounds: No  wheezing or rales.  Musculoskeletal:        General: Normal range of motion.     Cervical back: Normal range of motion and neck supple.  Lymphadenopathy:     Cervical: No cervical adenopathy.  Skin:    General: Skin is warm and dry.  Neurological:     Mental Status: He is alert and oriented to person, place, and time.  Psychiatric:        Behavior: Behavior normal.      UC Treatments / Results  Labs (all labs ordered are listed, but only abnormal results are displayed) Labs Reviewed - No data to display  EKG   Radiology No results found.  Procedures Procedures (including critical care time)  Medications Ordered in UC Medications - No data to display  Initial Impression / Assessment and Plan / UC Course  I have reviewed the triage vital signs and the nursing notes.  Pertinent labs & imaging results that were available during my care of the patient were reviewed by me and considered in my medical decision making (see chart for details).     Vitals and exam reassuring today, suspicious for viral respiratory infection.  Declines COVID testing today, treat with Flonase, DayQuil, NyQuil, supportive home care.  Return for worsening symptoms.  Final Clinical Impressions(s) / UC Diagnoses   Final diagnoses:  Viral URI     Discharge Instructions      You may try DayQuil, NyQuil, Flonase nasal spray twice daily, sinus rinses with warm saline, humidifiers, vapor rubs or essential oils.  Symptoms typically last between 5 and 7 days, if your symptoms are lasting more than 10 days follow-up for a recheck    ED Prescriptions   None    PDMP not reviewed this encounter.   Particia Nearing, [place] 09/28/22 1651

## 2022-09-28 NOTE — ED Triage Notes (Signed)
Nasal congestion, sneezing states nasal congestion is light green.  Symptoms started yesterday.

## 2023-01-16 DIAGNOSIS — L02229 Furuncle of trunk, unspecified: Secondary | ICD-10-CM | POA: Diagnosis not present

## 2023-01-16 DIAGNOSIS — L738 Other specified follicular disorders: Secondary | ICD-10-CM | POA: Diagnosis not present

## 2023-01-16 DIAGNOSIS — B9689 Other specified bacterial agents as the cause of diseases classified elsewhere: Secondary | ICD-10-CM | POA: Diagnosis not present

## 2023-03-28 DIAGNOSIS — F4322 Adjustment disorder with anxiety: Secondary | ICD-10-CM | POA: Diagnosis not present

## 2024-02-04 DIAGNOSIS — B9689 Other specified bacterial agents as the cause of diseases classified elsewhere: Secondary | ICD-10-CM | POA: Diagnosis not present

## 2024-02-04 DIAGNOSIS — L700 Acne vulgaris: Secondary | ICD-10-CM | POA: Diagnosis not present

## 2024-02-04 DIAGNOSIS — L02222 Furuncle of back [any part, except buttock]: Secondary | ICD-10-CM | POA: Diagnosis not present

## 2024-02-11 ENCOUNTER — Encounter: Payer: Self-pay | Admitting: Emergency Medicine

## 2024-02-11 ENCOUNTER — Ambulatory Visit: Admission: EM | Admit: 2024-02-11 | Discharge: 2024-02-11 | Disposition: A | Discharge status: Home / Self Care

## 2024-02-11 ENCOUNTER — Other Ambulatory Visit: Payer: Self-pay

## 2024-02-11 DIAGNOSIS — B349 Viral infection, unspecified: Secondary | ICD-10-CM

## 2024-02-11 DIAGNOSIS — L270 Generalized skin eruption due to drugs and medicaments taken internally: Secondary | ICD-10-CM

## 2024-02-11 NOTE — ED Provider Notes (Signed)
 [facility]    CSN: [id number] Arrival date & time: 02/11/24  9056      History   Chief Complaint Chief Complaint  Patient presents with   Rash    HPI Michael Mcdowell is a 33 y.o. male.   Patient presents today with concern for reaction to medication.  Patient reports Thursday, he started taking minocycline for acne on his back as prescribed by his dermatologist.  Friday, he began having neck pain, headache, nausea, chills, elevated heart rate into the 120s, and fever.  Reports he developed rash over the past couple of days that is itchy.  The rash is red and is small bumps on his feet, chest, back, arms, and neck.  Reports history of similar reaction when he took doxycycline several years ago.  Reports his dermatologist prescribed him doxycycline last year that he tolerated for about 3 months but took it inconsistently.  No sloughing of the skin or vomiting.    Past Medical History:  Diagnosis Date   Substance abuse in remission [facility])    Ulcerative colitis ([facility]) 06/2019    Patient Active Problem List   Diagnosis Date Noted   Constipation 09/28/2021   Perirectal abscess 09/28/2021   Diarrhea 08/19/2019   Ulcerative colitis with rectal bleeding (HCC) 07/10/2019   Rectal bleeding 05/20/2019   Colitis 05/20/2019   KNEE PAIN, RIGHT 10/15/2007   TALIPES CAVUS 10/15/2007    Past Surgical History:  Procedure Laterality Date   BIOPSY  06/08/2019   Procedure: BIOPSY;  Surgeon: Shaaron Lamar HERO, MD;  [facility];  Service: Endoscopy;;   COLONOSCOPY WITH PROPOFOL  N/A 06/08/2019   Inflamed rectum and left colon likely representing UC in evolution. Cecal: midly active colitis, de=scending colon with moderately active colitis, sigmoid with moderately active colitis, rectum with moderately active colitis   Tyhroglossal duct cyst         Home Medications    Prior to Admission medications   Medication Sig Start Date End Date Taking? Authorizing Provider   clindamycin (CLEOCIN T) 1 % lotion Apply topically 2 (two) times daily as needed. 02/04/24  Yes [provider]  acetaminophen  (TYLENOL ) 500 MG tablet Take 500-1,000 mg by mouth every 6 (six) hours as needed (pain.).    [provider]    Family History Family History  Problem Relation Age of Onset   Healthy Mother    Healthy Father    Cancer Paternal Grandmother    Colon cancer Neg Hx    Colon polyps Neg Hx    Inflammatory bowel disease Neg Hx     Social History Social History   Tobacco Use   Smoking status: Former    Types: Cigarettes   Smokeless tobacco: Former    Types: Chew   Tobacco comments:    no cigarettes in 4 years,   Vaping Use   Vaping status: Never Used  Substance Use Topics   Alcohol use: Yes    Comment: rare   Drug use: Not Currently    Comment: pain pills 5 years ago, none since then.      Allergies   Vibramycin [doxycycline] and Minocycline   Review of Systems Review of Systems Per HPI  Physical Exam Triage Vital Signs ED Triage Vitals  Encounter Vitals Group     BP 02/11/24 0954 127/77     Girls Systolic BP Percentile --      Girls Diastolic BP Percentile --      Boys Systolic BP Percentile --  Boys Diastolic BP Percentile --      Pulse Rate 02/11/24 0954 92     Resp 02/11/24 0954 20     Temp 02/11/24 0954 98.3 F (36.8 C)     Temp Source 02/11/24 0954 Oral     SpO2 02/11/24 0954 96 %     Weight --      Height --      Head Circumference --      Peak Flow --      Pain Score 02/11/24 0952 7     Pain Loc --      Pain Education --      Exclude from Growth Chart --    No data found.  Updated Vital Signs BP 127/77 (BP Location: Right Arm)   Pulse 92   Temp 98.3 F (36.8 C) (Oral)   Resp 20   SpO2 96%   Visual Acuity Right Eye Distance:   Left Eye Distance:   Bilateral Distance:    Right Eye Near:   Left Eye Near:    Bilateral Near:     Physical Exam Vitals and nursing note reviewed.   Constitutional:      General: He is not in acute distress.    Appearance: Normal appearance. He is not toxic-appearing.  HENT:     Head: Normocephalic and atraumatic.     Nose: Nose normal. No congestion or rhinorrhea.     Mouth/Throat:     Mouth: Mucous membranes are moist.     Pharynx: Oropharynx is clear. No oropharyngeal exudate or posterior oropharyngeal erythema.     Comments: Posterior oropharynx is patent Pulmonary:     Effort: Pulmonary effort is normal. No respiratory distress.  Musculoskeletal:     Cervical back: Normal range of motion and neck supple. Tenderness (Tenderness to paraspinal muscles) present. No rigidity. Pain with movement and muscular tenderness present. No spinous process tenderness. Normal range of motion.  Lymphadenopathy:     Cervical: No cervical adenopathy.  Skin:    General: Skin is warm and dry.     Capillary Refill: Capillary refill takes less than 2 seconds.     Coloration: Skin is not jaundiced or pale.     Findings: No erythema.  Neurological:     Mental Status: He is alert and oriented to person, place, and time.  Psychiatric:        Behavior: Behavior is cooperative.      UC Treatments / Results  Labs (all labs ordered are listed, but only abnormal results are displayed) Labs Reviewed - No data to display  EKG   Radiology No results found.  Procedures Procedures (including critical care time)  Medications Ordered in UC Medications - No data to display  Initial Impression / Assessment and Plan / UC Course  I have reviewed the triage vital signs and the nursing notes.  Pertinent labs & imaging results that were available during my care of the patient were reviewed by me and considered in my medical decision making (see chart for details).     *** Final Clinical Impressions(s) / UC Diagnoses   Final diagnoses:  Viral illness  Drug-induced maculopapular rash     Discharge Instructions      I suspect you have a  viral infection causing the fever, body aches, and high heart rate.  Increase hydration and continue tylenol  500 - 1000 mg every 6 hours alternating with ibuprofen 800 mg every 8 hours for pain or fever.  Symptoms should improve in  the next few days.  Seek care if symptoms worsen or do not improve.   I believe the itchy rash is an allergic reaction to minocycline; do not take anymore of it.  You can take cetirizine up to twice daily to help with itching.  Seek care in the ER if you develop shortness of breath or throat/tongue swelling.   ED Prescriptions   None    PDMP not reviewed this encounter.

## 2024-02-11 NOTE — Discharge Instructions (Signed)
 I suspect you have a viral infection causing the fever, body aches, and high heart rate.  Increase hydration and continue tylenol  500 - 1000 mg every 6 hours alternating with ibuprofen 800 mg every 8 hours for pain or fever.  Symptoms should improve in the next few days.  Seek care if symptoms worsen or do not improve.   I believe the itchy rash is an allergic reaction to minocycline; do not take anymore of it.  You can take cetirizine up to twice daily to help with itching.  Seek care in the ER if you develop shortness of breath or throat/tongue swelling.

## 2024-02-11 NOTE — ED Triage Notes (Addendum)
 Pt reports intermittent rash since Thursday and is inquiring if medication related. Pt reports started monocyline on Thursday and reports neck pain, headache, nausea. Saturday reports chills, elevated HR and fever.pt reports has not taken new medication since Saturday but reports symptoms remain.reports similar reaction to doxycycline several years ago.
